# Patient Record
Sex: Male | Born: 1985 | Race: White | Hispanic: No | Smoking: Former smoker
Health system: Southern US, Community
[De-identification: ages and names within clinical notes are randomized; demographics above are authoritative.]

## PROBLEM LIST (undated history)

## (undated) DIAGNOSIS — E669 Obesity, unspecified: Secondary | ICD-10-CM

## (undated) DIAGNOSIS — Z720 Tobacco use: Secondary | ICD-10-CM

## (undated) DIAGNOSIS — S61432A Puncture wound without foreign body of left hand, initial encounter: Secondary | ICD-10-CM

## (undated) DIAGNOSIS — Z8709 Personal history of other diseases of the respiratory system: Secondary | ICD-10-CM

## (undated) DIAGNOSIS — W3400XA Accidental discharge from unspecified firearms or gun, initial encounter: Secondary | ICD-10-CM

## (undated) HISTORY — DX: Obesity, unspecified: E66.9

## (undated) HISTORY — DX: Tobacco use: Z72.0

## (undated) HISTORY — PX: HARVEST BONE GRAFT: SHX377

---

## 2010-02-27 ENCOUNTER — Emergency Department (HOSPITAL_COMMUNITY)
Admission: EM | Admit: 2010-02-27 | Discharge: 2010-02-28 | Payer: Self-pay | Source: Home / Self Care | Admitting: Emergency Medicine

## 2012-04-26 DIAGNOSIS — S61432A Puncture wound without foreign body of left hand, initial encounter: Secondary | ICD-10-CM

## 2012-04-26 HISTORY — PX: HAND SURGERY: SHX662

## 2012-04-26 HISTORY — DX: Puncture wound without foreign body of left hand, initial encounter: S61.432A

## 2012-05-30 ENCOUNTER — Emergency Department (HOSPITAL_COMMUNITY)
Admission: EM | Admit: 2012-05-30 | Discharge: 2012-05-30 | Disposition: A | Discharge status: Home / Self Care | Payer: 59 | Attending: Emergency Medicine | Admitting: Emergency Medicine

## 2012-05-30 ENCOUNTER — Encounter (HOSPITAL_COMMUNITY): Payer: Self-pay | Admitting: Emergency Medicine

## 2012-05-30 ENCOUNTER — Emergency Department (HOSPITAL_COMMUNITY): Payer: 59

## 2012-05-30 DIAGNOSIS — J45909 Unspecified asthma, uncomplicated: Secondary | ICD-10-CM | POA: Insufficient documentation

## 2012-05-30 DIAGNOSIS — G8918 Other acute postprocedural pain: Secondary | ICD-10-CM | POA: Insufficient documentation

## 2012-05-30 DIAGNOSIS — M25549 Pain in joints of unspecified hand: Secondary | ICD-10-CM | POA: Insufficient documentation

## 2012-05-30 DIAGNOSIS — F17200 Nicotine dependence, unspecified, uncomplicated: Secondary | ICD-10-CM | POA: Insufficient documentation

## 2012-05-30 MED ORDER — OXYCODONE-ACETAMINOPHEN 5-325 MG PO TABS: 2.0000 | ORAL_TABLET | ORAL | Status: DC | PRN
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
## 2012-05-30 NOTE — ED Notes (Addendum)
Ortho tech notified for left wrist splint

## 2012-05-30 NOTE — Progress Notes (Signed)
Orthopedic Tech Progress Note Patient Details:  Edwin Williams [birth year] [id number]  Ortho Devices Type of Ortho Device: Arm sling;Volar splint Ortho Device/Splint Location: left hand/arm Ortho Device/Splint Interventions: Application   Nikki Dom 05/30/2012, 9:29 PM
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
## 2012-06-02 ENCOUNTER — Encounter (HOSPITAL_BASED_OUTPATIENT_CLINIC_OR_DEPARTMENT_OTHER): Payer: Self-pay | Admitting: *Deleted
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
## 2012-06-03 ENCOUNTER — Encounter (HOSPITAL_BASED_OUTPATIENT_CLINIC_OR_DEPARTMENT_OTHER)
Admission: RE | Disposition: A | Discharge status: Home / Self Care | Payer: Self-pay | Source: Ambulatory Visit | Attending: Orthopedic Surgery

## 2012-06-03 ENCOUNTER — Encounter (HOSPITAL_BASED_OUTPATIENT_CLINIC_OR_DEPARTMENT_OTHER): Payer: Self-pay | Admitting: *Deleted

## 2012-06-03 ENCOUNTER — Ambulatory Visit (HOSPITAL_BASED_OUTPATIENT_CLINIC_OR_DEPARTMENT_OTHER)
Admission: RE | Admit: 2012-06-03 | Discharge: 2012-06-03 | Disposition: A | Discharge status: Home / Self Care | Payer: 59 | Source: Ambulatory Visit | Attending: Orthopedic Surgery | Admitting: Orthopedic Surgery

## 2012-06-03 ENCOUNTER — Ambulatory Visit (HOSPITAL_BASED_OUTPATIENT_CLINIC_OR_DEPARTMENT_OTHER): Payer: 59 | Admitting: *Deleted

## 2012-06-03 DIAGNOSIS — W3400XA Accidental discharge from unspecified firearms or gun, initial encounter: Secondary | ICD-10-CM | POA: Insufficient documentation

## 2012-06-03 DIAGNOSIS — S61432S Puncture wound without foreign body of left hand, sequela: Secondary | ICD-10-CM

## 2012-06-03 DIAGNOSIS — S62309B Unspecified fracture of unspecified metacarpal bone, initial encounter for open fracture: Secondary | ICD-10-CM | POA: Insufficient documentation

## 2012-06-03 DIAGNOSIS — S66992S Other injury of unspecified muscle, fascia and tendon at wrist and hand level, left hand, sequela: Secondary | ICD-10-CM

## 2012-06-03 HISTORY — PX: OPEN REDUCTION INTERNAL FIXATION (ORIF) METACARPAL: SHX6234

## 2012-06-03 HISTORY — DX: Personal history of other diseases of the respiratory system: Z87.09

## 2012-06-03 HISTORY — PX: HARDWARE REMOVAL: SHX979

## 2012-06-03 HISTORY — DX: Accidental discharge from unspecified firearms or gun, initial encounter: W34.00XA

## 2012-06-03 HISTORY — DX: Puncture wound without foreign body of left hand, initial encounter: S61.432A

## 2012-06-03 LAB — POCT HEMOGLOBIN-HEMACUE: Hemoglobin: 17 g/dL (ref 13.0–17.0)

## 2012-06-03 SURGERY — REMOVAL, HARDWARE
Anesthesia: General | Site: Hand | Laterality: Left | Wound class: Contaminated

## 2012-06-03 MED ORDER — FENTANYL CITRATE 0.05 MG/ML IJ SOLN
50.0000 ug | INTRAMUSCULAR | Status: DC | PRN
  Administered 2012-06-03: 100 ug via INTRAVENOUS

## 2012-06-03 MED ORDER — OXYCODONE-ACETAMINOPHEN 5-325 MG PO TABS: 1.0000 | ORAL_TABLET | ORAL | Status: DC | PRN

## 2012-06-03 MED ORDER — HYDROMORPHONE HCL PF 1 MG/ML IJ SOLN
0.2500 mg | INTRAMUSCULAR | Status: DC | PRN
  Administered 2012-06-03 (×2): 0.5 mg via INTRAVENOUS

## 2012-06-03 MED ORDER — CEFAZOLIN SODIUM-DEXTROSE 2-3 GM-% IV SOLR
INTRAVENOUS | Status: DC | PRN
  Administered 2012-06-03: 2 g via INTRAVENOUS

## 2012-06-03 MED ORDER — BUPIVACAINE-EPINEPHRINE PF 0.5-1:200000 % IJ SOLN
INTRAMUSCULAR | Status: DC | PRN
  Administered 2012-06-03: 30 mL

## 2012-06-03 MED ORDER — OXYCODONE HCL 5 MG PO TABS
5.0000 mg | ORAL_TABLET | Freq: Once | ORAL | Status: AC | PRN
  Administered 2012-06-03: 5 mg via ORAL

## 2012-06-03 MED ORDER — MIDAZOLAM HCL 2 MG/2ML IJ SOLN
1.0000 mg | INTRAMUSCULAR | Status: DC | PRN
  Administered 2012-06-03: 2 mg via INTRAVENOUS

## 2012-06-03 MED ORDER — PROMETHAZINE HCL 25 MG/ML IJ SOLN
6.2500 mg | Freq: Once | INTRAMUSCULAR | Status: AC
  Administered 2012-06-03: 6.25 mg via INTRAVENOUS

## 2012-06-03 MED ORDER — LIDOCAINE HCL (CARDIAC) 20 MG/ML IV SOLN
INTRAVENOUS | Status: DC | PRN
  Administered 2012-06-03: 80 mg via INTRAVENOUS

## 2012-06-03 MED ORDER — OXYCODONE HCL 5 MG/5ML PO SOLN: 5.0000 mg | Freq: Once | ORAL | Status: AC | PRN

## 2012-06-03 MED ORDER — ONDANSETRON HCL 4 MG/2ML IJ SOLN
INTRAMUSCULAR | Status: DC | PRN
  Administered 2012-06-03: 4 mg via INTRAVENOUS

## 2012-06-03 MED ORDER — LACTATED RINGERS IV SOLN
INTRAVENOUS | Status: DC
  Administered 2012-06-03 (×2): via INTRAVENOUS

## 2012-06-03 MED ORDER — ONDANSETRON HCL 4 MG/2ML IJ SOLN
4.0000 mg | Freq: Once | INTRAMUSCULAR | Status: AC | PRN
  Administered 2012-06-03: 4 mg via INTRAVENOUS

## 2012-06-03 MED ORDER — BUPIVACAINE-EPINEPHRINE 0.5% -1:200000 IJ SOLN
INTRAMUSCULAR | Status: DC | PRN
  Administered 2012-06-03: 20 mL

## 2012-06-03 MED ORDER — PROPOFOL 10 MG/ML IV BOLUS
INTRAVENOUS | Status: DC | PRN
  Administered 2012-06-03: 200 mg via INTRAVENOUS

## 2012-06-03 MED ORDER — MEPERIDINE HCL 25 MG/ML IJ SOLN: 6.2500 mg | INTRAMUSCULAR | Status: DC | PRN

## 2012-06-03 MED ORDER — MIDAZOLAM HCL 2 MG/ML PO SYRP: 12.0000 mg | ORAL_SOLUTION | Freq: Once | ORAL | Status: DC | PRN

## 2012-06-03 MED ORDER — DEXAMETHASONE SODIUM PHOSPHATE 4 MG/ML IJ SOLN
INTRAMUSCULAR | Status: DC | PRN
  Administered 2012-06-03: 10 mg via INTRAVENOUS

## 2012-06-03 SURGICAL SUPPLY — 88 items
APL SKNCLS STERI-STRIP NONHPOA (GAUZE/BANDAGES/DRESSINGS) ×1
BANDAGE ELASTIC 3 VELCRO ST LF (GAUZE/BANDAGES/DRESSINGS) ×2 IMPLANT
BANDAGE ELASTIC 4 VELCRO ST LF (GAUZE/BANDAGES/DRESSINGS) ×2 IMPLANT
BANDAGE GAUZE ELAST BULKY 4 IN (GAUZE/BANDAGES/DRESSINGS) ×2 IMPLANT
BENZOIN TINCTURE PRP APPL 2/3 (GAUZE/BANDAGES/DRESSINGS) ×2 IMPLANT
BIT DRILL 2 FAST STEP (BIT) ×2 IMPLANT
BLADE MINI RND TIP GREEN BEAV (BLADE) IMPLANT
BLADE SURG 15 STRL LF DISP TIS (BLADE) ×1 IMPLANT
BLADE SURG 15 STRL SS (BLADE) ×2
BNDG CMPR 9X4 STRL LF SNTH (GAUZE/BANDAGES/DRESSINGS)
BNDG CMPR MD 5X2 ELC HKLP STRL (GAUZE/BANDAGES/DRESSINGS)
BNDG ELASTIC 2 VLCR STRL LF (GAUZE/BANDAGES/DRESSINGS) IMPLANT
BNDG ESMARK 4X9 LF (GAUZE/BANDAGES/DRESSINGS) IMPLANT
CANISTER SUCTION 1200CC (MISCELLANEOUS) IMPLANT
CLOTH BEACON ORANGE TIMEOUT ST (SAFETY) ×2 IMPLANT
CORDS BIPOLAR (ELECTRODE) ×2 IMPLANT
COVER TABLE BACK 60X90 (DRAPES) ×2 IMPLANT
CUFF TOURNIQUET SINGLE 18IN (TOURNIQUET CUFF) IMPLANT
CUFF TOURNIQUET SINGLE 24IN (TOURNIQUET CUFF) ×2 IMPLANT
DECANTER SPIKE VIAL GLASS SM (MISCELLANEOUS) IMPLANT
DRAPE EXTREMITY T 121X128X90 (DRAPE) ×2 IMPLANT
DRAPE INCISE IOBAN 66X45 STRL (DRAPES) ×2 IMPLANT
DRAPE OEC MINIVIEW 54X84 (DRAPES) ×2 IMPLANT
DRAPE PED LAPAROTOMY (DRAPES) ×2 IMPLANT
DRAPE SURG 17X23 STRL (DRAPES) ×10 IMPLANT
DURAPREP 26ML APPLICATOR (WOUND CARE) ×2 IMPLANT
ELECT REM PT RETURN 9FT ADLT (ELECTROSURGICAL) ×2
ELECTRODE REM PT RTRN 9FT ADLT (ELECTROSURGICAL) ×1 IMPLANT
GAUZE SPONGE 4X4 16PLY XRAY LF (GAUZE/BANDAGES/DRESSINGS) IMPLANT
GAUZE XEROFORM 1X8 LF (GAUZE/BANDAGES/DRESSINGS) ×2 IMPLANT
GLOVE BIO SURGEON STRL SZ8 (GLOVE) ×2 IMPLANT
GLOVE BIOGEL PI IND STRL 6.5 (GLOVE) ×1 IMPLANT
GLOVE BIOGEL PI IND STRL 8.5 (GLOVE) ×1 IMPLANT
GLOVE BIOGEL PI INDICATOR 6.5 (GLOVE) ×1
GLOVE BIOGEL PI INDICATOR 8.5 (GLOVE) ×1
GLOVE ECLIPSE 6.5 STRL STRAW (GLOVE) ×2 IMPLANT
GLOVE SURG ORTHO 8.0 STRL STRW (GLOVE) ×2 IMPLANT
GOWN BRE IMP PREV XXLGXLNG (GOWN DISPOSABLE) ×2 IMPLANT
GOWN PREVENTION PLUS XLARGE (GOWN DISPOSABLE) ×2 IMPLANT
GOWN PREVENTION PLUS XXLARGE (GOWN DISPOSABLE) ×4 IMPLANT
NEEDLE HYPO 25X1 1.5 SAFETY (NEEDLE) ×2 IMPLANT
NS IRRIG 1000ML POUR BTL (IV SOLUTION) IMPLANT
PACK BASIN DAY SURGERY FS (CUSTOM PROCEDURE TRAY) ×2 IMPLANT
PAD CAST 3X4 CTTN HI CHSV (CAST SUPPLIES) ×1 IMPLANT
PAD CAST 4YDX4 CTTN HI CHSV (CAST SUPPLIES) ×1 IMPLANT
PADDING CAST ABS 4INX4YD NS (CAST SUPPLIES) ×1
PADDING CAST ABS COTTON 4X4 ST (CAST SUPPLIES) ×1 IMPLANT
PADDING CAST COTTON 3X4 STRL (CAST SUPPLIES) ×2
PADDING CAST COTTON 4X4 STRL (CAST SUPPLIES) ×2
PADDING UNDERCAST 2  STERILE (CAST SUPPLIES) ×2 IMPLANT
PENCIL BUTTON HOLSTER BLD 10FT (ELECTRODE) ×2 IMPLANT
PLATE LOCK 2.5MM Y-SHAPE (Plate) ×2 IMPLANT
SCREW PEG 10MM (Screw) ×2 IMPLANT
SCREW PEG 15MM (Screw) ×6 IMPLANT
SCREW PEG 2.5X12 NONLOCK (Screw) ×2 IMPLANT
SCREW PEG 2.5X16 NONLOCK (Screw) ×4 IMPLANT
SCREW PEG LOCK 2.5X10 (Peg) ×2 IMPLANT
SCREW PEG LOCK 2.5X14 (Peg) ×2 IMPLANT
SHEET MEDIUM DRAPE 40X70 STRL (DRAPES) ×2 IMPLANT
SPLINT PLASTER CAST XFAST 4X15 (CAST SUPPLIES) IMPLANT
SPLINT PLASTER XTRA FAST SET 4 (CAST SUPPLIES)
SPONGE GAUZE 4X4 12PLY (GAUZE/BANDAGES/DRESSINGS) ×4 IMPLANT
SPONGE LAP 4X18 X RAY DECT (DISPOSABLE) ×4 IMPLANT
STOCKINETTE 4X48 STRL (DRAPES) ×2 IMPLANT
STRIP CLOSURE SKIN 1/2X4 (GAUZE/BANDAGES/DRESSINGS) ×2 IMPLANT
SUCTION FRAZIER TIP 10 FR DISP (SUCTIONS) IMPLANT
SUT ETHILON 3 0 PS 1 (SUTURE) IMPLANT
SUT ETHILON 4 0 PS 2 18 (SUTURE) IMPLANT
SUT ETHILON 5 0 PS 2 18 (SUTURE) IMPLANT
SUT MERSILENE 4 0 P 3 (SUTURE) IMPLANT
SUT MON AB 4-0 PC3 18 (SUTURE) IMPLANT
SUT PROLENE 3 0 PS 2 (SUTURE) IMPLANT
SUT VIC AB 0 CT3 27 (SUTURE) IMPLANT
SUT VIC AB 0 SH 27 (SUTURE) IMPLANT
SUT VIC AB 2-0 SH 27 (SUTURE) ×2
SUT VIC AB 2-0 SH 27XBRD (SUTURE) ×1 IMPLANT
SUT VIC AB 3-0 PS1 18 (SUTURE)
SUT VIC AB 3-0 PS1 18XBRD (SUTURE) IMPLANT
SUT VIC AB 4-0 P-3 18XBRD (SUTURE) IMPLANT
SUT VIC AB 4-0 P3 18 (SUTURE)
SUT VICRYL 4-0 PS2 18IN ABS (SUTURE) IMPLANT
SUT VICRYL RAPIDE 4/0 PS 2 (SUTURE) ×2 IMPLANT
SYR BULB 3OZ (MISCELLANEOUS) ×2 IMPLANT
SYRINGE 10CC LL (SYRINGE) ×2 IMPLANT
TOWEL OR 17X24 6PK STRL BLUE (TOWEL DISPOSABLE) ×2 IMPLANT
TUBE CONNECTING 20X1/4 (TUBING) IMPLANT
UNDERPAD 30X30 INCONTINENT (UNDERPADS AND DIAPERS) ×2 IMPLANT
WASHER 2.5 THREADED (Orthopedic Implant) ×2 IMPLANT

## 2012-06-03 NOTE — Op Note (Signed)
See note [phone]

## 2012-06-03 NOTE — Transfer of Care (Signed)
Immediate Anesthesia Transfer of Care Note  Patient: Edwin Williams  Procedure(s) Performed: Procedure(s): Removal of External Fixator Left Fifth Metacarpal (Left) Open reduction internal fixation left fifth metacarpal with illiac crest graft (Left)  Patient Location: PACU  Anesthesia Type:GA combined with regional for post-op pain  Level of Consciousness: sedated  Airway & Oxygen Therapy: Patient Spontanous Breathing and Patient connected to face mask oxygen  Post-op Assessment: Report given to PACU RN and Post -op Vital signs reviewed and stable  Post vital signs: Reviewed and stable  Complications: No apparent anesthesia complications
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
## 2012-06-03 NOTE — Anesthesia Preprocedure Evaluation (Signed)

## 2012-06-03 NOTE — Progress Notes (Signed)
Assisted Dr. Ossey with left, ultrasound guided, supraclavicular block. Side rails up, monitors on throughout procedure. See vital signs in flow sheet. Tolerated Procedure well. 

## 2012-06-03 NOTE — Anesthesia Postprocedure Evaluation (Signed)
Anesthesia Post Note  Patient: Edwin Williams  Procedure(s) Performed: Procedure(s) (LRB): Removal of External Fixator Left Fifth Metacarpal (Left) Open reduction internal fixation left fifth metacarpal with illiac crest graft (Left)  Anesthesia type: general  Patient location: PACU  Post pain: Pain level controlled  Post assessment: Patient's Cardiovascular Status Stable  Last Vitals:  Filed Vitals:   06/03/12 1645  BP: 100/68  Pulse: 82  Temp:   Resp: 15    Post vital signs: Reviewed and stable  Level of consciousness: sedated  Complications: No apparent anesthesia complications
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
## 2012-06-04 ENCOUNTER — Encounter (HOSPITAL_BASED_OUTPATIENT_CLINIC_OR_DEPARTMENT_OTHER): Payer: Self-pay | Admitting: Orthopedic Surgery
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
## 2012-07-11 ENCOUNTER — Emergency Department (HOSPITAL_COMMUNITY): Payer: 59

## 2012-07-11 ENCOUNTER — Emergency Department (HOSPITAL_COMMUNITY)
Admission: EM | Admit: 2012-07-11 | Discharge: 2012-07-11 | Disposition: A | Discharge status: Home / Self Care | Payer: 59 | Attending: Emergency Medicine | Admitting: Emergency Medicine

## 2012-07-11 DIAGNOSIS — F17200 Nicotine dependence, unspecified, uncomplicated: Secondary | ICD-10-CM | POA: Insufficient documentation

## 2012-07-11 DIAGNOSIS — Z880 Allergy status to penicillin: Secondary | ICD-10-CM | POA: Insufficient documentation

## 2012-07-11 DIAGNOSIS — Z87828 Personal history of other (healed) physical injury and trauma: Secondary | ICD-10-CM | POA: Insufficient documentation

## 2012-07-11 DIAGNOSIS — Y939 Activity, unspecified: Secondary | ICD-10-CM | POA: Insufficient documentation

## 2012-07-11 DIAGNOSIS — S6990XA Unspecified injury of unspecified wrist, hand and finger(s), initial encounter: Secondary | ICD-10-CM | POA: Insufficient documentation

## 2012-07-11 DIAGNOSIS — IMO0002 Reserved for concepts with insufficient information to code with codable children: Secondary | ICD-10-CM | POA: Insufficient documentation

## 2012-07-11 DIAGNOSIS — Y929 Unspecified place or not applicable: Secondary | ICD-10-CM | POA: Insufficient documentation

## 2012-07-11 DIAGNOSIS — J45909 Unspecified asthma, uncomplicated: Secondary | ICD-10-CM | POA: Insufficient documentation

## 2012-07-11 DIAGNOSIS — S6992XA Unspecified injury of left wrist, hand and finger(s), initial encounter: Secondary | ICD-10-CM

## 2012-07-11 MED ORDER — OXYCODONE-ACETAMINOPHEN 5-325 MG PO TABS
2.0000 | ORAL_TABLET | Freq: Once | ORAL | Status: AC
  Administered 2012-07-11: 2 via ORAL
  Filled 2012-07-11: qty 2

## 2012-07-11 MED ORDER — OXYCODONE-ACETAMINOPHEN 5-325 MG PO TABS: 1.0000 | ORAL_TABLET | ORAL | Status: DC | PRN

## 2012-07-11 NOTE — ED Notes (Signed)
Patient transported to X-ray 
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
## 2012-07-11 NOTE — ED Notes (Signed)
Pt can wiggle digits, sensation present, skin warm, dry and pink
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
## 2012-07-12 NOTE — ED Provider Notes (Signed)
  Medical screening examination/treatment/procedure(s) were performed by non-physician practitioner and as supervising physician I was immediately available for consultation/collaboration.    Dodger Sinning D Francy Mcilvaine, MD 07/12/12 2117 

## 2016-02-18 ENCOUNTER — Encounter (HOSPITAL_COMMUNITY): Payer: Self-pay | Admitting: Emergency Medicine

## 2016-02-18 ENCOUNTER — Emergency Department (HOSPITAL_COMMUNITY)
Admission: EM | Admit: 2016-02-18 | Discharge: 2016-02-18 | Disposition: A | Discharge status: Home / Self Care | Payer: 59 | Attending: Emergency Medicine | Admitting: Emergency Medicine

## 2016-02-18 ENCOUNTER — Emergency Department (HOSPITAL_COMMUNITY): Payer: 59

## 2016-02-18 DIAGNOSIS — Y929 Unspecified place or not applicable: Secondary | ICD-10-CM | POA: Insufficient documentation

## 2016-02-18 DIAGNOSIS — S62399A Other fracture of unspecified metacarpal bone, initial encounter for closed fracture: Secondary | ICD-10-CM

## 2016-02-18 DIAGNOSIS — W208XXA Other cause of strike by thrown, projected or falling object, initial encounter: Secondary | ICD-10-CM | POA: Insufficient documentation

## 2016-02-18 DIAGNOSIS — J45909 Unspecified asthma, uncomplicated: Secondary | ICD-10-CM | POA: Insufficient documentation

## 2016-02-18 DIAGNOSIS — Y999 Unspecified external cause status: Secondary | ICD-10-CM | POA: Insufficient documentation

## 2016-02-18 DIAGNOSIS — S63266A Dislocation of metacarpophalangeal joint of right little finger, initial encounter: Secondary | ICD-10-CM

## 2016-02-18 DIAGNOSIS — S62306A Unspecified fracture of fifth metacarpal bone, right hand, initial encounter for closed fracture: Secondary | ICD-10-CM | POA: Insufficient documentation

## 2016-02-18 DIAGNOSIS — Y939 Activity, unspecified: Secondary | ICD-10-CM | POA: Insufficient documentation

## 2016-02-18 DIAGNOSIS — F17210 Nicotine dependence, cigarettes, uncomplicated: Secondary | ICD-10-CM | POA: Insufficient documentation

## 2016-02-18 DIAGNOSIS — S63256A Unspecified dislocation of right little finger, initial encounter: Secondary | ICD-10-CM | POA: Insufficient documentation

## 2016-02-18 MED ORDER — OXYCODONE-ACETAMINOPHEN 5-325 MG PO TABS
1.0000 | ORAL_TABLET | ORAL | Status: DC | PRN
Start: 2016-02-18 — End: 2016-02-18
  Administered 2016-02-18: 1 via ORAL

## 2016-02-18 MED ORDER — LIDOCAINE HCL 2 % IJ SOLN
20.0000 mL | Freq: Once | INTRAMUSCULAR | Status: AC
  Administered 2016-02-18: 400 mg via INTRADERMAL
  Filled 2016-02-18: qty 20

## 2016-02-18 MED ORDER — HYDROMORPHONE HCL 2 MG/ML IJ SOLN
1.0000 mg | Freq: Once | INTRAMUSCULAR | Status: AC
  Administered 2016-02-18: 1 mg via INTRAVENOUS
  Filled 2016-02-18: qty 1

## 2016-02-18 MED ORDER — OXYCODONE-ACETAMINOPHEN 5-325 MG PO TABS
ORAL_TABLET | ORAL | Status: AC
  Filled 2016-02-18: qty 1

## 2016-02-18 NOTE — ED Notes (Signed)
Dr. Roxanne MinsGrahmig at bedside.

## 2016-02-18 NOTE — ED Notes (Signed)
Dr. Roxanne MinsGrahmig coming down to set hand. Ortho tech has been called to meet him in D34. C arm, lidocaine and suture tray at bedside.
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
## 2016-02-18 NOTE — Discharge Instructions (Addendum)
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
## 2016-02-18 NOTE — ED Triage Notes (Addendum)
Pt reports trailer door fell onto right hand causing pain and injury. Pt presents with swelling and deformity to right hand. + Pulses noted.

## 2016-11-11 ENCOUNTER — Encounter: Payer: Self-pay | Admitting: Physician Assistant

## 2016-11-11 ENCOUNTER — Ambulatory Visit (INDEPENDENT_AMBULATORY_CARE_PROVIDER_SITE_OTHER): Payer: PRIVATE HEALTH INSURANCE | Admitting: Physician Assistant

## 2016-11-11 VITALS — BP 134/82 | HR 84 | Ht 70.0 in | Wt 240.0 lb

## 2016-11-11 DIAGNOSIS — Z7689 Persons encountering health services in other specified circumstances: Secondary | ICD-10-CM

## 2016-11-11 DIAGNOSIS — G8929 Other chronic pain: Secondary | ICD-10-CM | POA: Diagnosis not present

## 2016-11-11 DIAGNOSIS — R202 Paresthesia of skin: Secondary | ICD-10-CM | POA: Diagnosis not present

## 2016-11-11 DIAGNOSIS — Z3009 Encounter for other general counseling and advice on contraception: Secondary | ICD-10-CM

## 2016-11-11 DIAGNOSIS — M25511 Pain in right shoulder: Secondary | ICD-10-CM | POA: Diagnosis not present

## 2016-11-11 DIAGNOSIS — F17200 Nicotine dependence, unspecified, uncomplicated: Secondary | ICD-10-CM | POA: Insufficient documentation

## 2016-11-11 DIAGNOSIS — Z6834 Body mass index (BMI) 34.0-34.9, adult: Secondary | ICD-10-CM

## 2016-11-11 DIAGNOSIS — E6609 Other obesity due to excess calories: Secondary | ICD-10-CM | POA: Insufficient documentation

## 2016-11-11 MED ORDER — DICLOFENAC SODIUM 75 MG PO TBEC: 75.0000 mg | DELAYED_RELEASE_TABLET | Freq: Two times a day (BID) | ORAL | 0 refills | Status: DC

## 2016-11-11 NOTE — Patient Instructions (Addendum)
Diclofenac 1 tablet twice a day for pain/inflammation Night-time spliting Formal physical therapy Follow-up with Sports Medicine
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
## 2016-11-15 ENCOUNTER — Telehealth: Payer: Self-pay

## 2016-11-15 DIAGNOSIS — G8929 Other chronic pain: Secondary | ICD-10-CM

## 2016-11-15 DIAGNOSIS — M25511 Pain in right shoulder: Principal | ICD-10-CM

## 2016-11-15 NOTE — Telephone Encounter (Signed)
Pt reports that he Voltaren makes him sick every time he takes it and is requesting something different. Please advise.  -EH/RMA

## 2016-11-18 MED ORDER — NAPROXEN 500 MG PO TABS: 500.0000 mg | ORAL_TABLET | Freq: Two times a day (BID) | ORAL | 0 refills | Status: DC

## 2016-11-18 NOTE — Telephone Encounter (Signed)
Discontinue Voltaren Start naprosyn 1 tab twice a day Instruct to take with food

## 2016-11-26 ENCOUNTER — Institutional Professional Consult (permissible substitution): Payer: PRIVATE HEALTH INSURANCE | Admitting: Sports Medicine

## 2016-11-26 DIAGNOSIS — Z0189 Encounter for other specified special examinations: Secondary | ICD-10-CM

## 2017-10-19 ENCOUNTER — Other Ambulatory Visit: Payer: Self-pay

## 2017-10-19 ENCOUNTER — Emergency Department
Admission: EM | Admit: 2017-10-19 | Discharge: 2017-10-19 | Disposition: A | Discharge status: Home / Self Care | Payer: Self-pay | Source: Home / Self Care | Attending: Emergency Medicine | Admitting: Emergency Medicine

## 2017-10-19 ENCOUNTER — Emergency Department (INDEPENDENT_AMBULATORY_CARE_PROVIDER_SITE_OTHER): Payer: Self-pay

## 2017-10-19 ENCOUNTER — Encounter: Payer: Self-pay | Admitting: Emergency Medicine

## 2017-10-19 DIAGNOSIS — S6992XA Unspecified injury of left wrist, hand and finger(s), initial encounter: Secondary | ICD-10-CM

## 2017-10-19 DIAGNOSIS — X58XXXA Exposure to other specified factors, initial encounter: Secondary | ICD-10-CM

## 2017-10-19 DIAGNOSIS — S60212A Contusion of left wrist, initial encounter: Secondary | ICD-10-CM

## 2017-10-19 DIAGNOSIS — S60222A Contusion of left hand, initial encounter: Secondary | ICD-10-CM

## 2017-10-19 MED ORDER — NAPROXEN 500 MG PO TABS: 500.0000 mg | ORAL_TABLET | Freq: Two times a day (BID) | ORAL | 0 refills | Status: DC

## 2017-10-19 MED ORDER — HYDROCODONE-ACETAMINOPHEN 5-325 MG PO TABS: 2.0000 | ORAL_TABLET | Freq: Every day | ORAL | 0 refills | Status: DC

## 2017-10-19 NOTE — Discharge Instructions (Addendum)
X-rays left hand and left wrist show soft tissue swelling left hand, but no fracture or dislocation seen. Treatment is rest, ice, Ace, elevation of left hand and wrist for the next week, then gradual increase range of motion and activity.  No heavy gripping or lifting with left hand or wrist. Naprosyn for mild to moderate pain. Small prescription for Vicodin, may take up to 2 at bedtime if needed for severe pain. Follow-up with your orthopedist within 1 week for recheck
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
## 2018-06-05 ENCOUNTER — Ambulatory Visit (INDEPENDENT_AMBULATORY_CARE_PROVIDER_SITE_OTHER): Payer: Self-pay | Admitting: Physician Assistant

## 2018-06-05 ENCOUNTER — Encounter: Payer: Self-pay | Admitting: Physician Assistant

## 2018-06-05 VITALS — Wt 230.0 lb

## 2018-06-05 DIAGNOSIS — F909 Attention-deficit hyperactivity disorder, unspecified type: Secondary | ICD-10-CM

## 2018-06-05 DIAGNOSIS — R030 Elevated blood-pressure reading, without diagnosis of hypertension: Secondary | ICD-10-CM

## 2018-06-05 MED ORDER — AMPHETAMINE-DEXTROAMPHET ER 10 MG PO CP24: 10.0000 mg | ORAL_CAPSULE | Freq: Every day | ORAL | 0 refills | Status: DC
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
## 2018-07-02 ENCOUNTER — Ambulatory Visit: Payer: Self-pay | Admitting: Physician Assistant

## 2018-07-03 ENCOUNTER — Ambulatory Visit (INDEPENDENT_AMBULATORY_CARE_PROVIDER_SITE_OTHER): Payer: PRIVATE HEALTH INSURANCE | Admitting: Physician Assistant

## 2018-07-03 ENCOUNTER — Encounter: Payer: Self-pay | Admitting: Physician Assistant

## 2018-07-03 VITALS — BP 130/76 | HR 90 | Temp 99.2°F | Resp 14 | Wt 235.0 lb

## 2018-07-03 DIAGNOSIS — I10 Essential (primary) hypertension: Secondary | ICD-10-CM | POA: Diagnosis not present

## 2018-07-03 DIAGNOSIS — Z130 Encounter for screening for diseases of the blood and blood-forming organs and certain disorders involving the immune mechanism: Secondary | ICD-10-CM

## 2018-07-03 DIAGNOSIS — F17210 Nicotine dependence, cigarettes, uncomplicated: Secondary | ICD-10-CM | POA: Diagnosis not present

## 2018-07-03 DIAGNOSIS — F909 Attention-deficit hyperactivity disorder, unspecified type: Secondary | ICD-10-CM | POA: Diagnosis not present

## 2018-07-03 DIAGNOSIS — R509 Fever, unspecified: Secondary | ICD-10-CM | POA: Diagnosis not present

## 2018-07-03 DIAGNOSIS — Z1329 Encounter for screening for other suspected endocrine disorder: Secondary | ICD-10-CM

## 2018-07-03 DIAGNOSIS — Z1389 Encounter for screening for other disorder: Secondary | ICD-10-CM

## 2018-07-03 MED ORDER — AMPHETAMINE-DEXTROAMPHET ER 10 MG PO CP24: 10.0000 mg | ORAL_CAPSULE | Freq: Every day | ORAL | 0 refills | Status: DC

## 2018-07-03 MED ORDER — AMPHETAMINE-DEXTROAMPHETAMINE 10 MG PO TABS: 10.0000 mg | ORAL_TABLET | Freq: Every day | ORAL | 0 refills | Status: DC

## 2018-07-03 MED ORDER — VARENICLINE TARTRATE 0.5 MG X 11 & 1 MG X 42 PO MISC: ORAL | 0 refills | Status: DC

## 2018-07-03 MED ORDER — AMLODIPINE BESYLATE 5 MG PO TABS: 5.0000 mg | ORAL_TABLET | Freq: Every day | ORAL | 5 refills | Status: DC

## 2018-07-03 NOTE — Patient Instructions (Addendum)
For your blood pressure: - Goal <130/80 (Ideally 120's/70's) - take your blood pressure medication in the morning (unless instructed differently) - monitor and log blood pressures at home - check around the same time each day in a relaxed setting - Limit salt to <2500 mg/day - Follow DASH (Dietary Approach to Stopping Hypertension) eating plan - Try to get at least 150 minutes of aerobic exercise per week - Aim to go on a brisk walk 30 minutes per day at least 5 days per week. If you're not active, gradually increase how long you walk by 5 minutes each week - limit alcohol: 2 standard drinks per day for men and 1 per day for women - avoid tobacco/nicotine products. Consider smoking cessation if you smoke - weight loss: 7% of current body weight can reduce your blood pressure by 5-10 points - follow-up at least every 6 months for your blood pressure. Follow-up sooner if your BP is not controlled   Coping with Quitting Smoking  Quitting smoking is a physical and mental challenge. You will face cravings, withdrawal symptoms, and temptation. Before quitting, work with your health care provider to make a plan that can help you cope. Preparation can help you quit and keep you from giving in. How can I cope with cravings? Cravings usually last for 5-10 minutes. If you get through it, the craving will pass. Consider taking the following actions to help you cope with cravings:  Keep your mouth busy: ? Chew sugar-free gum. ? Suck on hard candies or a straw. ? Brush your teeth.  Keep your hands and body busy: ? Immediately change to a different activity when you feel a craving. ? Squeeze or play with a ball. ? Do an activity or a hobby, like making bead jewelry, practicing needlepoint, or working with wood. ? Mix up your normal routine. ? Take a short exercise break. Go for a quick walk or run up and down stairs. ? Spend time in public places where smoking is not allowed.  Focus on doing  something kind or helpful for someone else.  Call a friend or family member to talk during a craving.  Join a support group.  Call a quit line, such as 1-800-QUIT-NOW.  Talk with your health care provider about medicines that might help you cope with cravings and make quitting easier for you. How can I deal with withdrawal symptoms? Your body may experience negative effects as it tries to get used to not having nicotine in the system. These effects are called withdrawal symptoms. They may include:  Feeling hungrier than normal.  Trouble concentrating.  Irritability.  Trouble sleeping.  Feeling depressed.  Restlessness and agitation.  Craving a cigarette. To manage withdrawal symptoms:  Avoid places, people, and activities that trigger your cravings.  Remember why you want to quit.  Get plenty of sleep.  Avoid coffee and other caffeinated drinks. These may worsen some of your symptoms. How can I handle social situations? Social situations can be difficult when you are quitting smoking, especially in the first few weeks. To manage this, you can:  Avoid parties, bars, and other social situations where people might be smoking.  Avoid alcohol.  Leave right away if you have the urge to smoke.  Explain to your family and friends that you are quitting smoking. Ask for understanding and support.  Plan activities with friends or family where smoking is not an option. What are some ways I can cope with stress? Wanting to smoke may  cause stress, and stress can make you want to smoke. Find ways to manage your stress. Relaxation techniques can help. For example:  Breathe slowly and deeply, in through your nose and out through your mouth.  Listen to soothing, relaxing music.  Talk with a family member or friend about your stress.  Light a candle.  Soak in a bath or take a shower.  Think about a peaceful place. What are some ways I can prevent weight gain? Be aware that  many people gain weight after they quit smoking. However, not everyone does. To keep from gaining weight, have a plan in place before you quit and stick to the plan after you quit. Your plan should include:  Having healthy snacks. When you have a craving, it may help to: ? Eat plain popcorn, crunchy carrots, celery, or other cut vegetables. ? Chew sugar-free gum.  Changing how you eat: ? Eat small portion sizes at meals. ? Eat 4-6 small meals throughout the day instead of 1-2 large meals a day. ? Be mindful when you eat. Do not watch television or do other things that might distract you as you eat.  Exercising regularly: ? Make time to exercise each day. If you do not have time for a long workout, do short bouts of exercise for 5-10 minutes several times a day. ? Do some form of strengthening exercise, like weight lifting, and some form of aerobic exercise, like running or swimming.  Drinking plenty of water or other low-calorie or no-calorie drinks. Drink 6-8 glasses of water daily, or as much as instructed by your health care provider. Summary  Quitting smoking is a physical and mental challenge. You will face cravings, withdrawal symptoms, and temptation to smoke again. Preparation can help you as you go through these challenges.  You can cope with cravings by keeping your mouth busy (such as by chewing gum), keeping your body and hands busy, and making calls to family, friends, or a helpline for people who want to quit smoking.  You can cope with withdrawal symptoms by avoiding places where people smoke, avoiding drinks with caffeine, and getting plenty of rest.  Ask your health care provider about the different ways to prevent weight gain, avoid stress, and handle social situations. This information is not intended to replace advice given to you by your health care provider. Make sure you discuss any questions you have with your health care provider. Document Released: 01/19/2016  Document Revised: 01/19/2016 Document Reviewed: 01/19/2016 Elsevier Interactive Patient Education  2019 [employer]
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
## 2018-07-04 LAB — CBC
HCT: 48.4 % (ref 38.5–50.0)
Hemoglobin: 17 g/dL (ref 13.2–17.1)
MCH: 31 pg (ref 27.0–33.0)
MCHC: 35.1 g/dL (ref 32.0–36.0)
MCV: 88.3 fL (ref 80.0–100.0)
MPV: 9.2 fL (ref 7.5–12.5)
Platelets: 373 10*3/uL (ref 140–400)
RBC: 5.48 10*6/uL (ref 4.20–5.80)
RDW: 12.7 % (ref 11.0–15.0)
WBC: 10.6 10*3/uL (ref 3.8–10.8)

## 2018-07-04 LAB — URINALYSIS, ROUTINE W REFLEX MICROSCOPIC
Bilirubin Urine: NEGATIVE
Glucose, UA: NEGATIVE
Hgb urine dipstick: NEGATIVE
Leukocytes,Ua: NEGATIVE
Nitrite: NEGATIVE
Protein, ur: NEGATIVE
Specific Gravity, Urine: 1.025 (ref 1.001–1.03)
pH: 5.5 (ref 5.0–8.0)

## 2018-07-04 LAB — COMPLETE METABOLIC PANEL WITH GFR
AG Ratio: 2.5 (calc) (ref 1.0–2.5)
ALT: 20 U/L (ref 9–46)
AST: 15 U/L (ref 10–40)
Albumin: 4.8 g/dL (ref 3.6–5.1)
Alkaline phosphatase (APISO): 64 U/L (ref 36–130)
BUN: 12 mg/dL (ref 7–25)
CO2: 27 mmol/L (ref 20–32)
Calcium: 9.9 mg/dL (ref 8.6–10.3)
Chloride: 106 mmol/L (ref 98–110)
Creat: 0.91 mg/dL (ref 0.60–1.35)
GFR, Est African American: 130 mL/min/{1.73_m2} (ref >=60)
GFR, Est Non African American: 112 mL/min/{1.73_m2} (ref >=60)
Globulin: 1.9 g/dL (calc) (ref 1.9–3.7)
Glucose, Bld: 93 mg/dL (ref 65–99)
Potassium: 4.1 mmol/L (ref 3.5–5.3)
Sodium: 141 mmol/L (ref 135–146)
Total Bilirubin: 0.5 mg/dL (ref 0.2–1.2)
Total Protein: 6.7 g/dL (ref 6.1–8.1)

## 2018-07-04 LAB — TSH+FREE T4: TSH W/REFLEX TO FT4: 2 mIU/L (ref 0.40–4.50)

## 2018-07-06 ENCOUNTER — Encounter: Payer: Self-pay | Admitting: Physician Assistant

## 2018-07-06 DIAGNOSIS — R824 Acetonuria: Secondary | ICD-10-CM | POA: Insufficient documentation

## 2018-08-04 ENCOUNTER — Ambulatory Visit (INDEPENDENT_AMBULATORY_CARE_PROVIDER_SITE_OTHER): Payer: PRIVATE HEALTH INSURANCE | Admitting: Physician Assistant

## 2018-08-04 ENCOUNTER — Encounter: Payer: Self-pay | Admitting: Physician Assistant

## 2018-08-04 DIAGNOSIS — F909 Attention-deficit hyperactivity disorder, unspecified type: Secondary | ICD-10-CM | POA: Diagnosis not present

## 2018-08-04 DIAGNOSIS — I10 Essential (primary) hypertension: Secondary | ICD-10-CM | POA: Diagnosis not present

## 2018-08-04 MED ORDER — AMPHETAMINE-DEXTROAMPHETAMINE 10 MG PO TABS: 10.0000 mg | ORAL_TABLET | Freq: Every day | ORAL | 0 refills | Status: DC

## 2018-08-04 MED ORDER — AMPHETAMINE-DEXTROAMPHET ER 10 MG PO CP24: 10.0000 mg | ORAL_CAPSULE | Freq: Every day | ORAL | 0 refills | Status: DC

## 2018-08-04 MED ORDER — AMLODIPINE BESYLATE 5 MG PO TABS: 5.0000 mg | ORAL_TABLET | Freq: Every day | ORAL | 0 refills | Status: DC
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
## 2018-08-19 ENCOUNTER — Encounter: Payer: Self-pay | Admitting: Physician Assistant

## 2018-08-26 ENCOUNTER — Encounter: Payer: Self-pay | Admitting: Physician Assistant

## 2018-09-04 ENCOUNTER — Other Ambulatory Visit: Payer: Self-pay | Admitting: Physician Assistant

## 2018-09-04 ENCOUNTER — Encounter: Payer: Self-pay | Admitting: Physician Assistant

## 2018-09-04 DIAGNOSIS — F909 Attention-deficit hyperactivity disorder, unspecified type: Secondary | ICD-10-CM

## 2018-09-07 ENCOUNTER — Encounter: Payer: Self-pay | Admitting: Physician Assistant

## 2018-09-07 ENCOUNTER — Ambulatory Visit (INDEPENDENT_AMBULATORY_CARE_PROVIDER_SITE_OTHER): Payer: PRIVATE HEALTH INSURANCE | Admitting: Physician Assistant

## 2018-09-07 ENCOUNTER — Other Ambulatory Visit: Payer: Self-pay

## 2018-09-07 VITALS — BP 122/73 | HR 97 | Temp 98.2°F | Wt 237.0 lb

## 2018-09-07 DIAGNOSIS — L03119 Cellulitis of unspecified part of limb: Secondary | ICD-10-CM | POA: Diagnosis not present

## 2018-09-07 DIAGNOSIS — F909 Attention-deficit hyperactivity disorder, unspecified type: Secondary | ICD-10-CM

## 2018-09-07 DIAGNOSIS — L02419 Cutaneous abscess of limb, unspecified: Secondary | ICD-10-CM

## 2018-09-07 MED ORDER — DOXYCYCLINE HYCLATE 100 MG PO TABS: 100.0000 mg | ORAL_TABLET | Freq: Two times a day (BID) | ORAL | 0 refills | Status: AC

## 2018-09-07 MED ORDER — HYDROCODONE-ACETAMINOPHEN 5-325 MG PO TABS: 1.0000 | ORAL_TABLET | Freq: Four times a day (QID) | ORAL | 0 refills | Status: AC | PRN

## 2018-09-07 MED ORDER — AMPHETAMINE-DEXTROAMPHET ER 10 MG PO CP24: 10.0000 mg | ORAL_CAPSULE | Freq: Every day | ORAL | 0 refills | Status: DC

## 2018-09-07 MED ORDER — AMPHETAMINE-DEXTROAMPHETAMINE 10 MG PO TABS: 10.0000 mg | ORAL_TABLET | Freq: Every day | ORAL | 0 refills | Status: DC
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
## 2018-09-07 NOTE — Patient Instructions (Signed)
Skin Abscess  A skin abscess is an infected area on or under your skin that contains a collection of pus and other material. An abscess may also be called a furuncle, carbuncle, or boil. An abscess can occur in or on almost any part of your body. Some abscesses break open (rupture) on their own. Most continue to get worse unless they are treated. The infection can spread deeper into the body and eventually into your blood, which can make you feel ill. Treatment usually involves draining the abscess. What are the causes? An abscess occurs when germs, like bacteria, pass through your skin and cause an infection. This may be caused by:  A scrape or cut on your skin.  A puncture wound through your skin, including a needle injection or insect bite.  Blocked oil or sweat glands.  Blocked and infected hair follicles.  A cyst that forms beneath your skin (sebaceous cyst) and becomes infected. What increases the risk? This condition is more likely to develop in people who:  Have a weak body defense system (immune system).  Have diabetes.  Have dry and irritated skin.  Get frequent injections or use illegal IV drugs.  Have a foreign body in a wound, such as a splinter.  Have problems with their lymph system or veins. What are the signs or symptoms? Symptoms of this condition include:  A painful, firm bump under the skin.  A bump with pus at the top. This may break through the skin and drain. Other symptoms include:  Redness surrounding the abscess site.  Warmth.  Swelling of the lymph nodes (glands) near the abscess.  Tenderness.  A sore on the skin. How is this diagnosed? This condition may be diagnosed based on:  A physical exam.  Your medical history.  A sample of pus. This may be used to find out what is causing the infection.  Blood tests.  Imaging tests, such as an ultrasound, CT scan, or MRI. How is this treated? A small abscess that drains on its own may not  need treatment. Treatment for larger abscesses may include:  Moist heat or heat pack applied to the area several times a day.  A procedure to drain the abscess (incision and drainage).  Antibiotic medicines. For a severe abscess, you may first get antibiotics through an IV and then change to antibiotics by mouth. Follow these instructions at home: Medicines   Take over-the-counter and prescription medicines only as told by your health care provider.  If you were prescribed an antibiotic medicine, take it as told by your health care provider. Do not stop taking the antibiotic even if you start to feel better. Abscess care   If you have an abscess that has not drained, apply heat to the affected area. Use the heat source that your health care provider recommends, such as a moist heat pack or a heating pad. ? Place a towel between your skin and the heat source. ? Leave the heat on for 20-30 minutes. ? Remove the heat if your skin turns bright red. This is especially important if you are unable to feel pain, heat, or cold. You may have a greater risk of getting burned.  Follow instructions from your health care provider about how to take care of your abscess. Make sure you: ? Cover the abscess with a bandage (dressing). ? Change your dressing or gauze as told by your health care provider. ? Wash your hands with soap and water before you change the   dressing or gauze. If soap and water are not available, use hand sanitizer.  Check your abscess every day for signs of a worsening infection. Check for: ? More redness, swelling, or pain. ? More fluid or blood. ? Warmth. ? More pus or a bad smell. General instructions  To avoid spreading the infection: ? Do not share personal care items, towels, or hot tubs with others. ? Avoid making skin contact with other people.  Keep all follow-up visits as told by your health care provider. This is important. Contact a health care provider if you  have:  More redness, swelling, or pain around your abscess.  More fluid or blood coming from your abscess.  Warm skin around your abscess.  More pus or a bad smell coming from your abscess.  A fever.  Muscle aches.  Chills or a general ill feeling. Get help right away if you:  Have severe pain.  See red streaks on your skin spreading away from the abscess. Summary  A skin abscess is an infected area on or under your skin that contains a collection of pus and other material.  A small abscess that drains on its own may not need treatment.  Treatment for larger abscesses may include having a procedure to drain the abscess and taking an antibiotic. This information is not intended to replace advice given to you by your health care provider. Make sure you discuss any questions you have with your health care provider. Document Released: 10/31/2004 Document Revised: 05/14/2018 Document Reviewed: 03/06/2017 Elsevier Patient Education  2020 [employer]  

## 2018-09-08 ENCOUNTER — Encounter: Payer: Self-pay | Admitting: Physician Assistant

## 2018-09-09 ENCOUNTER — Other Ambulatory Visit: Payer: Self-pay

## 2018-09-09 ENCOUNTER — Ambulatory Visit (INDEPENDENT_AMBULATORY_CARE_PROVIDER_SITE_OTHER): Payer: PRIVATE HEALTH INSURANCE | Admitting: Physician Assistant

## 2018-09-09 VITALS — BP 114/75 | HR 87 | Temp 98.0°F

## 2018-09-09 DIAGNOSIS — L03119 Cellulitis of unspecified part of limb: Secondary | ICD-10-CM

## 2018-09-09 DIAGNOSIS — L02419 Cutaneous abscess of limb, unspecified: Secondary | ICD-10-CM
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
## 2018-09-10 LAB — WOUND CULTURE
MICRO NUMBER:: 730483
SPECIMEN QUALITY:: ADEQUATE

## 2018-09-11 ENCOUNTER — Encounter: Payer: Self-pay | Admitting: Physician Assistant

## 2018-09-11 ENCOUNTER — Ambulatory Visit (INDEPENDENT_AMBULATORY_CARE_PROVIDER_SITE_OTHER): Payer: PRIVATE HEALTH INSURANCE | Admitting: Physician Assistant

## 2018-09-11 ENCOUNTER — Other Ambulatory Visit: Payer: Self-pay

## 2018-09-11 VITALS — BP 112/68 | HR 85 | Temp 98.5°F

## 2018-09-11 DIAGNOSIS — L0390 Cellulitis, unspecified: Secondary | ICD-10-CM

## 2018-09-11 DIAGNOSIS — B9562 Methicillin resistant Staphylococcus aureus infection as the cause of diseases classified elsewhere: Secondary | ICD-10-CM | POA: Insufficient documentation

## 2018-09-11 MED ORDER — CLINDAMYCIN HCL 300 MG PO CAPS: 300.0000 mg | ORAL_CAPSULE | Freq: Three times a day (TID) | ORAL | 0 refills | Status: AC
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
## 2018-09-11 NOTE — Patient Instructions (Signed)
MRSA Infection, Diagnosis, Adult Methicillin-resistant Staphylococcus aureus (MRSA) infection is caused by bacteria called Staphylococcus aureus, or staph, that no longer respond to common antibiotic medicines (drug-resistant bacteria). MRSA infection can be hard to treat. Most of the time, MRSA can be on the skin or in the nose without causing problems (colonized). However, if MRSA enters the body through a cut, a sore, or an invasive medical device, it can cause a serious infection. What are the causes? This condition is caused by staph bacteria. Illness may develop after exposure to the bacteria through:  Skin-to-skin contact with someone who is infected with MRSA.  Touching surfaces that have the bacteria on them.  Having a procedure or using equipment that allows MRSA to enter the body.  Having MRSA that lives on your skin and then enters your body through: ? A cut or scratch. ? A surgery or procedure. ? The use of a medical device. Contact with the bacteria may occur:  During a stay in a hospital, rehabilitation facility, nursing home, or other health care facility (health care-associated MRSA).  In daily activities where there is close contact with others, such as sports, child care centers, or at home (community-associated MRSA). What increases the risk? You are more likely to develop this condition if you:  Have a surgery or procedure.  Have an IV or a thin tube (catheter) placed in your body.  Are elderly.  Are on kidney dialysis.  Have recently taken an antibiotic medicine.  Live in a long-term care facility.  Have a chronic wound or skin ulcer.  Have a weak body defense system (immune system).  Play sports that involve skin-to-skin contact.  Live in a crowded place, like a dormitory or military barracks.  Share towels, razors, or sports equipment with other people.  Have a history of MRSA infection or colonization. What are the signs or symptoms? Symptoms  of this condition depend on the area that is affected. Symptoms may include:  A pus-filled pimple or boil.  Pus that drains from your skin.  A sore (abscess) under your skin or somewhere in your body.  Fever with or without chills.  Difficulty breathing.  Coughing up blood.  Redness, warmth, swelling, or pain in the affected area. How is this diagnosed? This condition may be diagnosed based on:  A physical exam.  Your medical history.  Taking a sample from the infected area and growing it in a lab (culture). You may also have other tests, including:  Imaging tests, such as X-rays, a CT scan, or an MRI.  Lab tests, such as blood, urine, or phlegm (sputum) tests. You skin or nose may be swabbed when you are admitted to a health care facility for a procedure. This is to screen for MRSA. How is this treated? Treatment depends on the type of MRSA infection you have and how severe, deep, or extensive it is. Treatment may include:  Antibiotic medicines.  Surgery to drain pus from the infected area. Severe infections may require a hospital stay. Follow these instructions at home: Medicines  Take over-the-counter and prescription medicines only as told by your health care provider.  If you were prescribed an antibiotic medicine, use it as told by your health care provider. Do not stop using the antibiotic even if you start to feel better. Prevention Follow these instructions to avoid spreading the infection to others:  Wash your hands frequently with soap and water. If soap and water are not available, use an alcohol-based hand sanitizer.    Avoid close contact with those around you as much as possible. Do not use towels, razors, toothbrushes, bedding, or other items that will be used by others.  Wash towels, bedding, and clothes in the washing machine with detergent and hot water. Dry them in a hot dryer.  Clean surfaces regularly to remove germs (disinfection). Use products  or solutions that contain bleach. Make sure you disinfect bathroom surfaces, food preparation areas, exercise equipment, and doorknobs.  General instructions  If you have a wound, follow instructions from your health care provider about how to take care of your wound. ? Do not pick at scabs. ? Do not try to drain any infection sites or pimples.  Tell all your health care providers that you have MRSA, or if you have ever had a MRSA infection.  Keep all follow-up visits as told by your health care provider. This is important. Contact a health care provider if you:  Do not get better.  Have symptoms that get worse.  Have new symptoms. Get help right away if you have:  Nausea or vomiting, or if you cannot take medicine without vomiting.  Trouble breathing.  Chest pain. These symptoms may represent a serious problem that is an emergency. Do not wait to see if the symptoms will go away. Get medical help right away. Call your local emergency services (911 in the [place] Do not drive yourself to the hospital. Summary  MRSA infection is caused by bacteria called Staphylococcus aureus, or staph, that no longer respond to common antibiotic medicines.  Treatment for this condition depends on the type of MRSA infection you have and how severe, deep, and extensive it is.  If you were prescribed an antibiotic medicine, use it as told by your health care provider. Do not stop using the antibiotic even if you start to feel better.  Follow instructions from your health care provider to avoid spreading the infection to others. This information is not intended to replace advice given to you by your health care provider. Make sure you discuss any questions you have with your health care provider. Document Released: 01/21/2005 Document Revised: 04/09/2018 Document Reviewed: 04/10/2018 Elsevier Patient Education  2020 [employer]   MRSA Infection, Self-Care, Adult Methicillin-resistant  Staphylococcus aureus (MRSA) infection is caused by bacteria that no longer respond to antibiotic medicines. MRSA infection can be hard to treat. Self-care is important after being diagnosed with MRSA. Following instructions for self-care will:  Help you heal well.  Prevent the infection from spreading to others. Your health care provider may also give you more specific instructions. If you have problems or questions, contact your health care provider. What are the risks? MRSA can be on the skin or in the nose of many people without causing problems. This is called MRSA colonization. However, MRSA can cause problems for you and others if it enters the body through a cut, a sore, or an invasive medical procedure or device.  If MRSA enters your body, it may cause serious problems, such as: ? Skin infections. ? Bone or joint infections. ? Pneumonia. ? Bloodstream infections (sepsis).  If you have these infections, MRSA may spread to others, including: ? Visitors or other patients in the hospital. ? Friends, family, or other people at home or in your community. Supplies needed:  Soap and water.  Germ-free (sterile) dressing.  Alcohol-based hand sanitizer (optional).  Home cleaning solutions that contain bleach.  Clean or disposable towels. How to prevent MRSA infections Take these steps to  avoid getting another MRSA infection and to prevent the bacteria from spreading to others. Hand washing   Wash your hands frequently with soap and water. If soap and water are not available, use an alcohol-based hand sanitizer. Dry your hands with a clean or disposable towel.  Make sure that everyone in the household washes their hands often. Wound care  If you have a wound, follow instructions from your health care provider about how to take care of your wound. Make sure you:  Wash your hands with soap and water before and after you change your bandage (dressing). If soap and water are not  available, use an alcohol-based hand sanitizer.  Change your dressing as told by your health care provider.  Leave any stitches (sutures), skin glue, or adhesive strips in place. These skin closures may need to be in place for 2 weeks or longer. If adhesive strip edges start to loosen and curl up, you may trim the loose edges. Do not remove adhesive strips completely unless your health care provider tells you to do that.  Clean wounds, cuts, and abrasions with soap and water and cover them with dry, sterile dressings until they heal.  Check your wound every day for signs of infection. Check for: ? More redness, swelling, or pain. ? More fluid or blood. ? Warmth. ? Pus or a bad smell.  If you have a wound that seems to be infected, ask your health care provider if a culture should be done for MRSA and other bacteria. Personal hygiene  Maintain good hygiene by bathing often and keeping your body clean.  Wash hands before preparing food.  Always shower after exercising.  Wash towels, bedding, and clothes in the washing machine with detergent and hot water. Dry them in a hot dryer. General tips  Take your antibiotic medicine as told by your health care provider. Take them only when absolutely necessary. Do not stop taking the antibiotic even if you start to feel better.  Avoid close contact with others as much as possible.  Do not use towels, razors, toothbrushes, bedding, or other items that will be used by others.  Clean surfaces regularly to remove germs (disinfection). Use products or solutions that contain bleach. Make sure you disinfect bathroom surfaces, food preparation areas, and doorknobs. Follow these instructions at home:  Take over-the-counter and prescription medicines only as told by your health care provider.  Tell all health care providers who care for you that you have MRSA or that you have had MRSA.  If you are breastfeeding, talk to your health care provider  about MRSA. You may be asked to temporarily stop breastfeeding.  If you have an invasive medical device, make sure that you know how to take care of it to prevent infection.  Keep all follow-up visits as told by your health care provider. This is important. Contact a health care provider if:  Your infection seems to be getting worse. Signs may include: ? Warmth, redness, or tenderness around your wound site. ? A red line that spreads from your infection site. ? A dark color in the area around your infection. ? Wound drainage that is tan, yellow, or green. ? A bad smell coming from your wound.  You have symptoms of a new MRSA infection: ? A pus-filled pimple. ? A boil on your skin. ? Pus draining from your skin. ? A sore (abscess) under your skin or somewhere in your body. ? Fever with or without chills. Get help right  away if:  You have trouble breathing.  You feel nauseous, you vomit, or you cannot take medicine without vomiting.  You have chest pain. These symptoms may represent a serious problem that is an emergency. Do not wait to see if the symptoms will go away. Get medical help right away. Call your local emergency services (911 in the [place] Do not drive yourself to the hospital. Summary  Self-care is important after being diagnosed with MRSA. This will help you heal well and prevent the infection from spreading to others.  Wash your hands often, avoid close contact with others, and avoid sharing towels, razors, toothbrushes, and bedding with other people. This will help prevent the infection from spreading to others.  If you are being treated for a MRSA infection, make sure to take over-the-counter and prescription medicines as told. Finish all antibiotic medicine even if you start to feel better.  If you have a wound, follow instructions from your health care provider about how to take care of it. Check your wound every day for signs of infection. This information is  not intended to replace advice given to you by your health care provider. Make sure you discuss any questions you have with your health care provider. Document Released: 04/26/2005 Document Revised: 04/09/2018 Document Reviewed: 04/10/2018 Elsevier Patient Education  2020 [employer]

## 2018-09-29 ENCOUNTER — Encounter: Payer: Self-pay | Admitting: Physician Assistant

## 2018-10-07 ENCOUNTER — Other Ambulatory Visit: Payer: Self-pay | Admitting: Physician Assistant

## 2018-10-07 DIAGNOSIS — F909 Attention-deficit hyperactivity disorder, unspecified type: Secondary | ICD-10-CM

## 2018-10-08 MED ORDER — AMPHETAMINE-DEXTROAMPHET ER 10 MG PO CP24: 10.0000 mg | ORAL_CAPSULE | Freq: Every day | ORAL | 0 refills | Status: DC

## 2018-10-08 MED ORDER — AMPHETAMINE-DEXTROAMPHETAMINE 10 MG PO TABS: 10.0000 mg | ORAL_TABLET | Freq: Every day | ORAL | 0 refills | Status: DC

## 2018-10-22 ENCOUNTER — Encounter: Payer: Self-pay | Admitting: Physician Assistant

## 2018-10-22 ENCOUNTER — Ambulatory Visit (INDEPENDENT_AMBULATORY_CARE_PROVIDER_SITE_OTHER): Payer: PRIVATE HEALTH INSURANCE | Admitting: Physician Assistant

## 2018-10-22 ENCOUNTER — Other Ambulatory Visit: Payer: Self-pay

## 2018-10-22 VITALS — BP 132/77 | HR 90 | Temp 98.0°F | Wt 239.0 lb

## 2018-10-22 DIAGNOSIS — F17210 Nicotine dependence, cigarettes, uncomplicated: Secondary | ICD-10-CM

## 2018-10-22 DIAGNOSIS — F909 Attention-deficit hyperactivity disorder, unspecified type: Secondary | ICD-10-CM

## 2018-10-22 DIAGNOSIS — I10 Essential (primary) hypertension: Secondary | ICD-10-CM | POA: Diagnosis not present

## 2018-10-22 MED ORDER — AMPHETAMINE-DEXTROAMPHETAMINE 10 MG PO TABS: 10.0000 mg | ORAL_TABLET | Freq: Every day | ORAL | 0 refills | Status: DC

## 2018-10-22 MED ORDER — AMPHETAMINE-DEXTROAMPHET ER 10 MG PO CP24: 10.0000 mg | ORAL_CAPSULE | Freq: Every day | ORAL | 0 refills | Status: DC
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
## 2018-11-05 ENCOUNTER — Ambulatory Visit: Payer: PRIVATE HEALTH INSURANCE | Admitting: Physician Assistant

## 2018-12-24 ENCOUNTER — Other Ambulatory Visit: Payer: Self-pay | Admitting: Physician Assistant

## 2018-12-24 ENCOUNTER — Other Ambulatory Visit: Payer: Self-pay | Admitting: Osteopathic Medicine

## 2018-12-24 DIAGNOSIS — F909 Attention-deficit hyperactivity disorder, unspecified type: Secondary | ICD-10-CM

## 2018-12-24 MED ORDER — AMPHETAMINE-DEXTROAMPHETAMINE 10 MG PO TABS: 10.0000 mg | ORAL_TABLET | Freq: Every day | ORAL | 0 refills | Status: DC

## 2018-12-24 MED ORDER — AMPHETAMINE-DEXTROAMPHET ER 10 MG PO CP24: 10.0000 mg | ORAL_CAPSULE | Freq: Every day | ORAL | 0 refills | Status: DC

## 2018-12-24 NOTE — Telephone Encounter (Signed)
Ok to refill but pt needs to keep apt or no further refills will be authorized by me!

## 2018-12-24 NOTE — Telephone Encounter (Signed)
Patient is aware and did not have any questions. He stated " I will keep the appointment" .

## 2018-12-24 NOTE — Telephone Encounter (Signed)
Patient has a follow up on 01/21/2019. Please advise.

## 2019-01-21 ENCOUNTER — Other Ambulatory Visit: Payer: Self-pay

## 2019-01-21 ENCOUNTER — Ambulatory Visit (INDEPENDENT_AMBULATORY_CARE_PROVIDER_SITE_OTHER): Payer: PRIVATE HEALTH INSURANCE | Admitting: Osteopathic Medicine

## 2019-01-21 ENCOUNTER — Encounter: Payer: Self-pay | Admitting: Osteopathic Medicine

## 2019-01-21 DIAGNOSIS — F909 Attention-deficit hyperactivity disorder, unspecified type: Secondary | ICD-10-CM | POA: Diagnosis not present

## 2019-01-21 MED ORDER — AMPHETAMINE-DEXTROAMPHET ER 20 MG PO CP24: 20.0000 mg | ORAL_CAPSULE | Freq: Every day | ORAL | 0 refills | Status: DC

## 2019-01-21 MED ORDER — AMPHETAMINE-DEXTROAMPHETAMINE 10 MG PO TABS: 10.0000 mg | ORAL_TABLET | Freq: Every day | ORAL | 0 refills | Status: DC

## 2019-01-21 MED ORDER — IBUPROFEN 800 MG PO TABS: 800.0000 mg | ORAL_TABLET | Freq: Three times a day (TID) | ORAL | 1 refills | Status: DC | PRN
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
## 2019-02-09 ENCOUNTER — Ambulatory Visit (INDEPENDENT_AMBULATORY_CARE_PROVIDER_SITE_OTHER): Payer: PRIVATE HEALTH INSURANCE | Admitting: Medical-Surgical

## 2019-02-09 ENCOUNTER — Other Ambulatory Visit: Payer: Self-pay

## 2019-02-09 ENCOUNTER — Encounter: Payer: Self-pay | Admitting: Medical-Surgical

## 2019-02-09 VITALS — BP 125/71 | HR 86 | Temp 98.1°F | Ht 71.0 in | Wt 236.1 lb

## 2019-02-09 DIAGNOSIS — L03113 Cellulitis of right upper limb: Secondary | ICD-10-CM

## 2019-02-09 MED ORDER — CLINDAMYCIN HCL 150 MG PO CAPS: 450.0000 mg | ORAL_CAPSULE | Freq: Three times a day (TID) | ORAL | 0 refills | Status: AC
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
## 2019-02-11 ENCOUNTER — Encounter: Payer: Self-pay | Admitting: Medical-Surgical

## 2019-02-11 ENCOUNTER — Ambulatory Visit (INDEPENDENT_AMBULATORY_CARE_PROVIDER_SITE_OTHER): Payer: PRIVATE HEALTH INSURANCE | Admitting: Medical-Surgical

## 2019-02-11 DIAGNOSIS — L03113 Cellulitis of right upper limb: Secondary | ICD-10-CM

## 2019-02-11 IMAGING — DX DG HAND COMPLETE 3+V*L*
3 series · 3 of 3 positions shown · non-contrast
Comparison: Radiographs 07/11/2012

CLINICAL DATA: Injured hand.

EXAM:
LEFT HAND - COMPLETE 3+ VIEW

[hand pa]
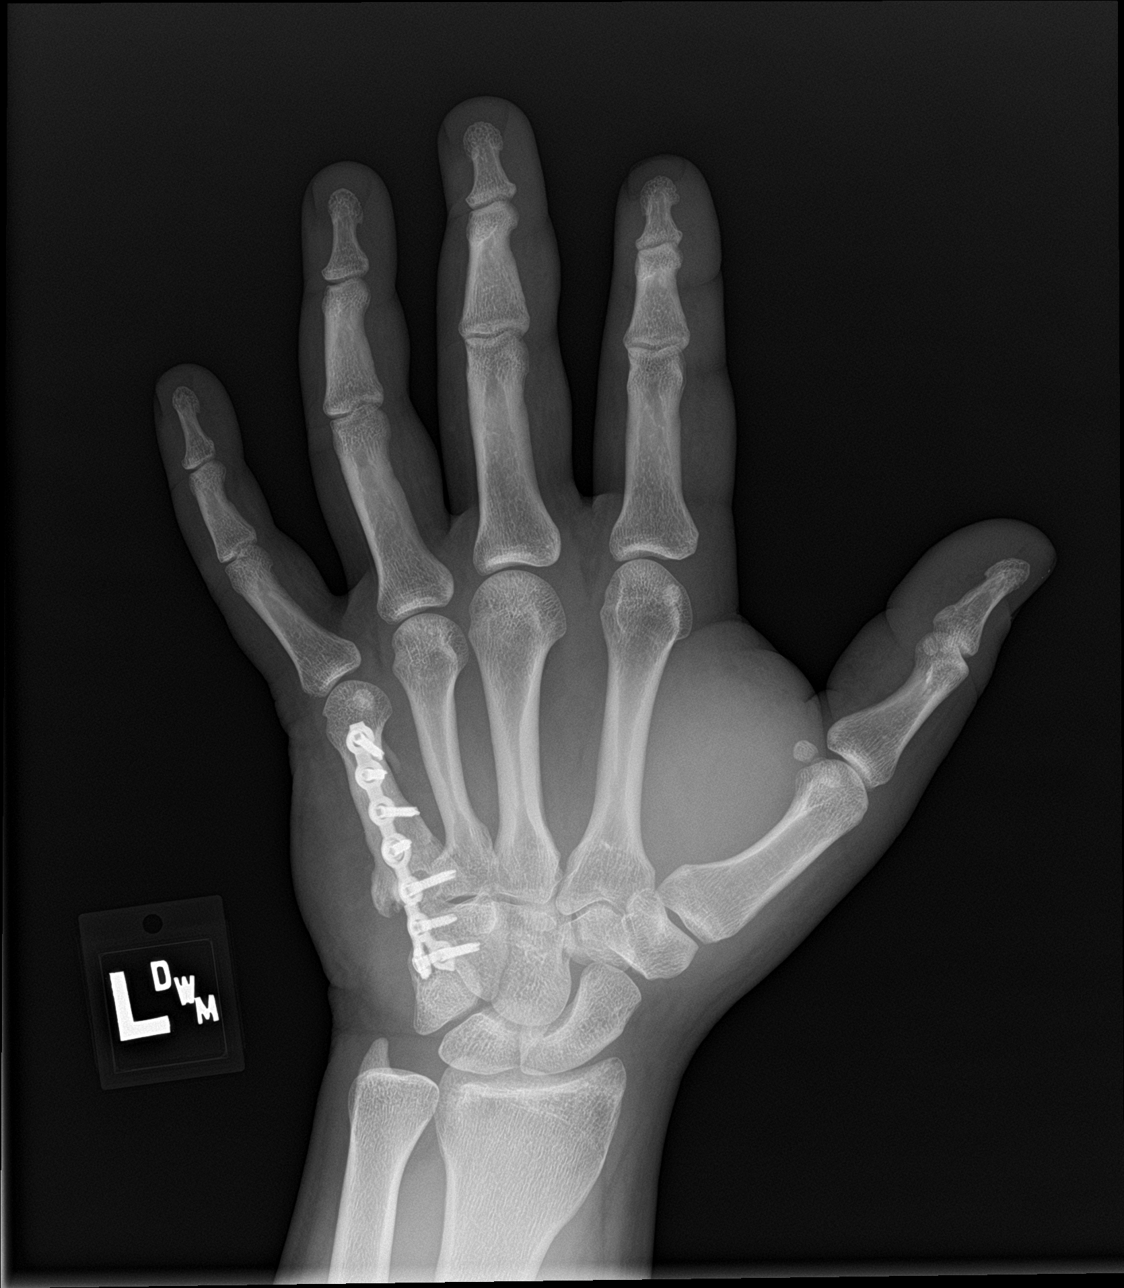

[hand obl]
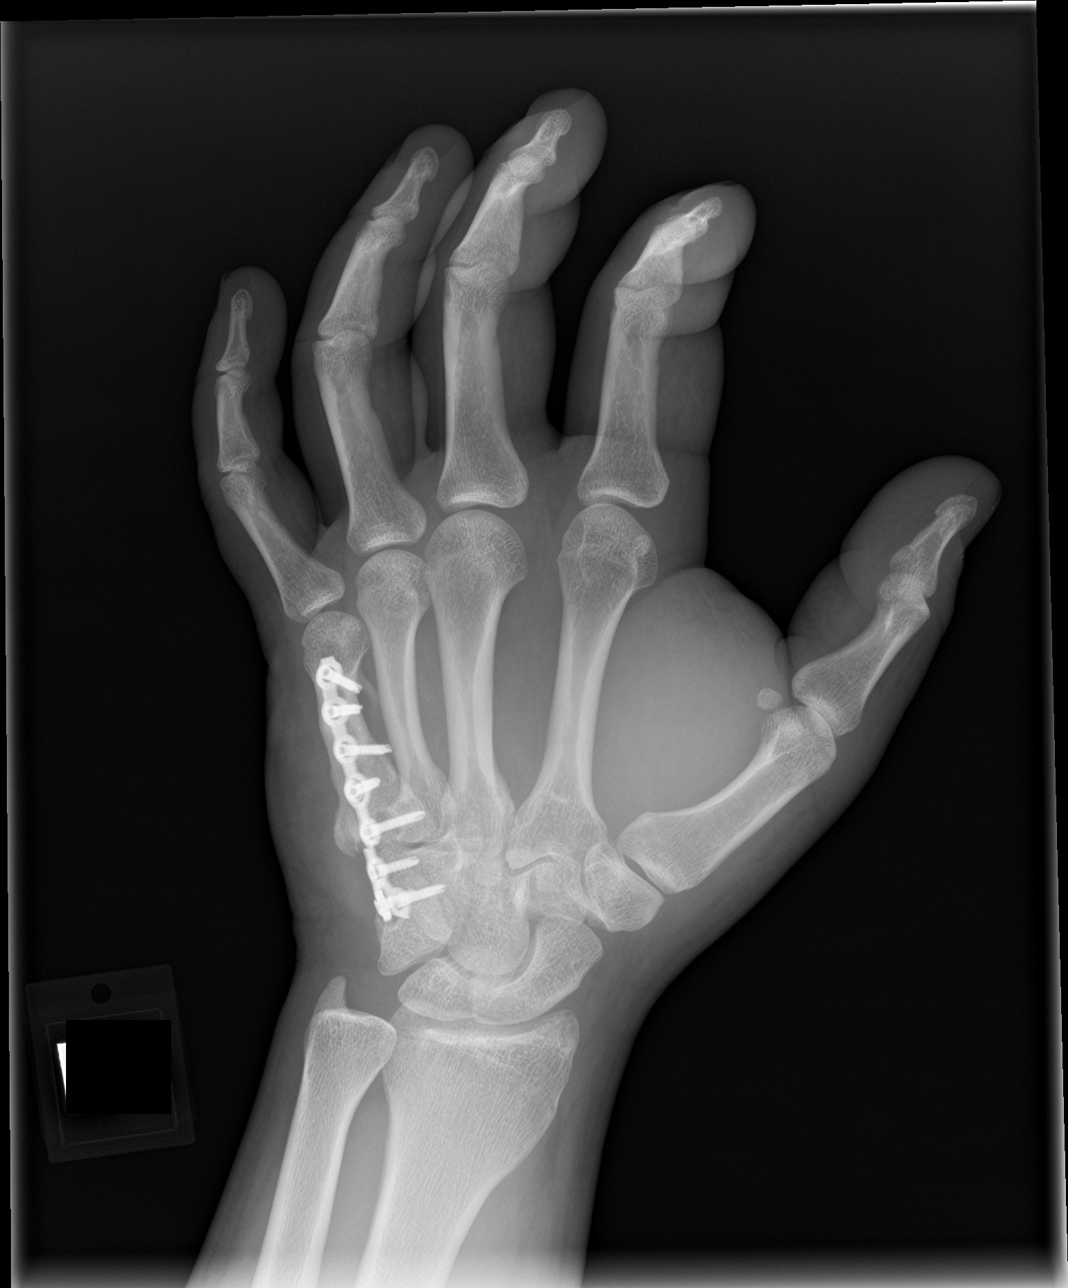

[hand lat]
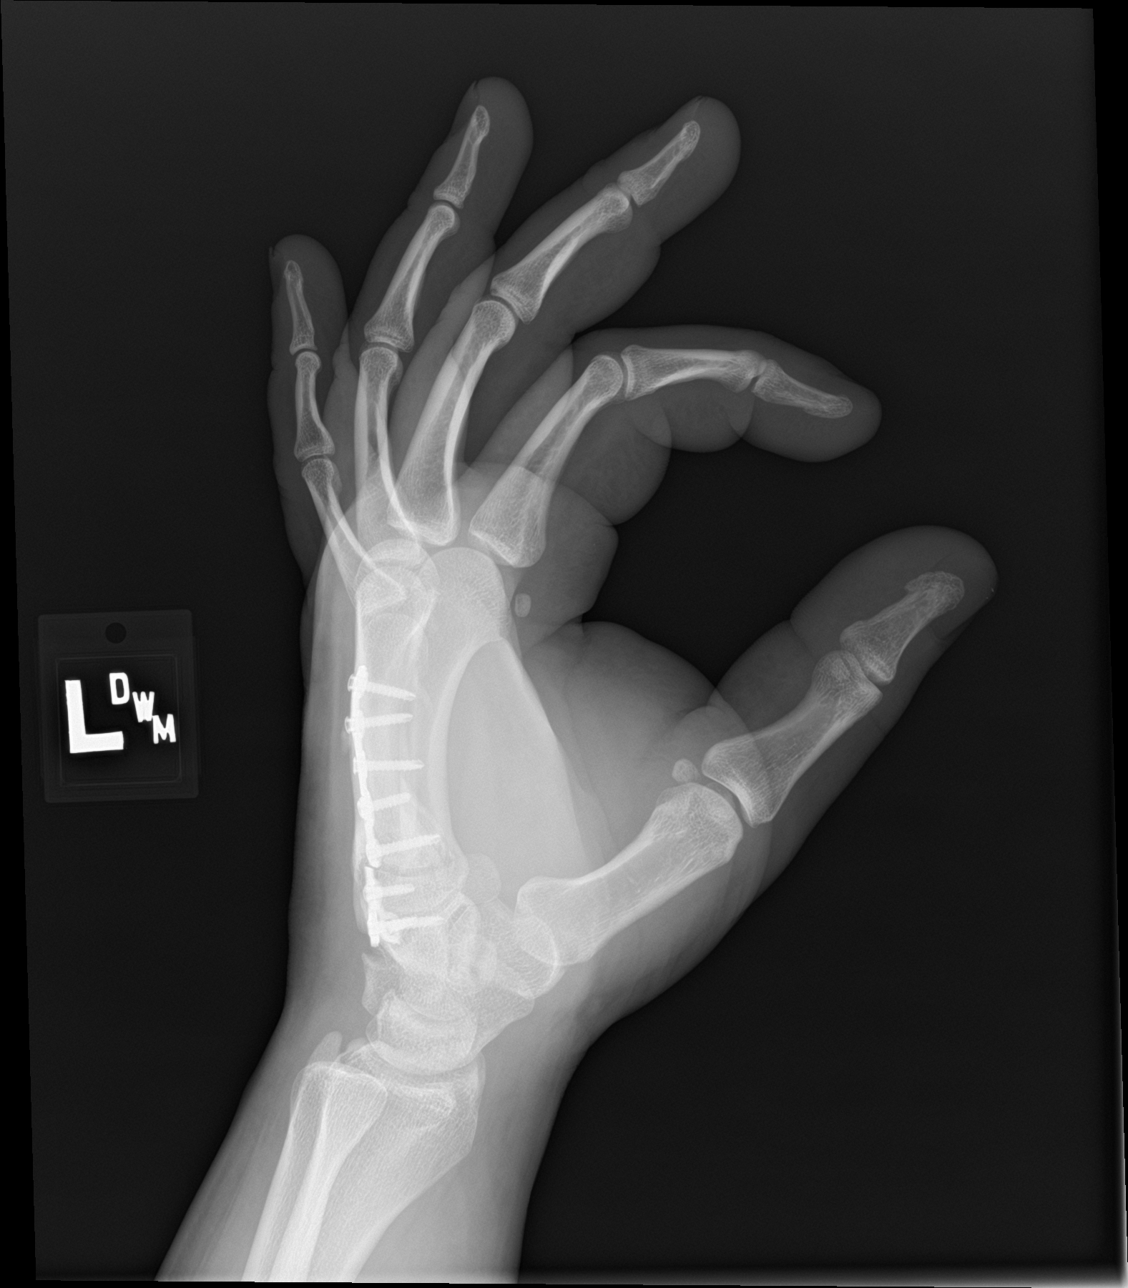

[3 of 3 positions shown; findings below may reference images not displayed]

FINDINGS: As demonstrated on the wrist films there is remote fixation hardware
along the ulnar aspect of the hand and wrist and the plate is
fractured at the fifth carpometacarpal joint.

No acute hand fracture is identified. There is prominence and
increased density involving the thenar musculature between the thumb
and second metacarpal suspicious for a hematoma.
IMPRESSION: 1. Remote fixation hardware as above.
2. No acute wrist or hand fracture.
3. Suspect soft tissue injury between the first and second
metacarpals, likely hematoma.

## 2019-02-11 NOTE — Progress Notes (Addendum)
Virtual Visit via Video Note  I connected with Edwin Williams on 02/11/19 at  1:00 PM EST by a video enabled telemedicine application and verified that I am speaking with the correct person using two identifiers.   I discussed the limitations of evaluation and management by telemedicine and the availability of in person appointments. The patient expressed understanding and agreed to proceed.  Subjective:    CC: Right elbow cellulitis follow-up  HPI:  34 year old male presenting today to follow-up on right elbow cellulitis.  Has taken 4 doses of 450 mg clindamycin.  Reports that the redness and swelling has not worsened but does not seem to have gotten a lot better.  The site is now draining small amounts of clear fluid.  Reports induration at the site of redness but no areas that feel like there is fluid underneath.  Continued edema at the elbow.  Denies fever, chills, myalgias, and purulent drainage.   Past medical history, Surgical history, Family history not pertinant except as noted below, Social history, Allergies, and medications have been entered into the medical record, reviewed, and corrections made.   Review of Systems: No fevers, chills, night sweats, weight loss, chest pain, or shortness of breath.   Objective:    General: Speaking clearly in complete sentences without any shortness of breath.  Alert and oriented x3.  Normal judgment. No apparent acute distress.   Previously marked sharpie lines still present.  Reduction in redness noted along distal edges of markings.  Previously noted pinpoint scab replaced by small break in skin.  Unable to measure or capture image due to virtual nature of appointment.    Impression and Recommendations:    Cellulitis of right upper extremity   Continue clindamycin 450 mg 3 times daily for remaining course.  Consider keeping covered to contain drainage and prevent scratching/picking.  Monitor drainage for any changes in color or  consistency.  Continue to monitor site for development of a fluid pocket that may need to be drained.  If no resolution, will need an office evaluation for possible I&D.  I discussed the assessment and treatment plan with the patient. The patient was provided an opportunity to ask questions and all were answered. The patient agreed with the plan and demonstrated an understanding of the instructions.   The patient was advised to call back or seek an in-person evaluation if the symptoms worsen or if the condition fails to improve as anticipated.  Return if symptoms worsen or fail to improve.  20 minutes of non-face-to-face time was provided during this encounter.  Thayer Ohm, DNP, APRN, FNP-BC [facility] and Sports Medicine

## 2019-02-19 ENCOUNTER — Encounter: Payer: Self-pay | Admitting: Sports Medicine

## 2019-02-19 ENCOUNTER — Other Ambulatory Visit: Payer: Self-pay

## 2019-02-19 ENCOUNTER — Telehealth: Payer: Self-pay | Admitting: Physician Assistant

## 2019-02-19 ENCOUNTER — Ambulatory Visit (INDEPENDENT_AMBULATORY_CARE_PROVIDER_SITE_OTHER): Payer: PRIVATE HEALTH INSURANCE | Admitting: Sports Medicine

## 2019-02-19 DIAGNOSIS — L0390 Cellulitis, unspecified: Secondary | ICD-10-CM

## 2019-02-19 DIAGNOSIS — F909 Attention-deficit hyperactivity disorder, unspecified type: Secondary | ICD-10-CM

## 2019-02-19 DIAGNOSIS — B9562 Methicillin resistant Staphylococcus aureus infection as the cause of diseases classified elsewhere: Secondary | ICD-10-CM | POA: Diagnosis not present

## 2019-02-19 MED ORDER — DOXYCYCLINE HYCLATE 100 MG PO TABS: 100.0000 mg | ORAL_TABLET | Freq: Two times a day (BID) | ORAL | 0 refills | Status: AC

## 2019-02-19 NOTE — Progress Notes (Signed)
    Procedures performed today:    None.  Independent interpretation of tests performed by another provider:   None.  Impression and Recommendations:    Cellulitis due to MRSA This is a pleasant 34 year old male, he had a right elbow cellulitis, treated quite appropriately by Christen Butter, NP. He has done well with clindamycin has noted good improvement. Cultures were obtained that grew out MRSA, sensitive to doxycycline and clindamycin. He still has a bit of erythema and a palpable nodule that feels like a sebaceous cyst. I am going to add another course of antibiotics, a week of doxycycline this time. He will continue warm compresses. I would like to see him back in about 2 weeks, at that point we will consider doing a surgical excision if I can still feel a palpable nodule under his skin. He also has what feels to be a sebaceous cyst on his left low back that we can remove at a future date as well.   ___________________________________________ Ihor Austin. Benjamin Stain, M.D., ABFM., CAQSM. Primary Care and Sports Medicine [facility]  Adjunct Instructor of Family Medicine  [employer]

## 2019-02-19 NOTE — Telephone Encounter (Signed)
Pt states he seen Dr.Alexander last for his Adderall and she was requesting that he call back to let her know if dosage is working well and she would refill . Please let patient know when this has been called in

## 2019-02-19 NOTE — Assessment & Plan Note (Signed)
This is a pleasant 34 year old male, he had a right elbow cellulitis, treated quite appropriately by Christen Butter, NP. He has done well with clindamycin has noted good improvement. Cultures were obtained that grew out MRSA, sensitive to doxycycline and clindamycin. He still has a bit of erythema and a palpable nodule that feels like a sebaceous cyst. I am going to add another course of antibiotics, a week of doxycycline this time. He will continue warm compresses. I would like to see him back in about 2 weeks, at that point we will consider doing a surgical excision if I can still feel a palpable nodule under his skin. He also has what feels to be a sebaceous cyst on his left low back that we can remove at a future date as well.

## 2019-02-20 MED ORDER — AMPHETAMINE-DEXTROAMPHET ER 20 MG PO CP24: 20.0000 mg | ORAL_CAPSULE | Freq: Every day | ORAL | 0 refills | Status: DC

## 2019-02-20 MED ORDER — AMPHETAMINE-DEXTROAMPHETAMINE 10 MG PO TABS: 10.0000 mg | ORAL_TABLET | Freq: Every day | ORAL | 0 refills | Status: DC

## 2019-02-20 NOTE — Telephone Encounter (Signed)
Sent!

## 2019-02-22 ENCOUNTER — Ambulatory Visit: Payer: PRIVATE HEALTH INSURANCE | Admitting: Medical-Surgical

## 2019-02-22 NOTE — Telephone Encounter (Signed)
I called patient and he is aware and did not have any questions.

## 2019-03-09 ENCOUNTER — Ambulatory Visit (INDEPENDENT_AMBULATORY_CARE_PROVIDER_SITE_OTHER): Payer: PRIVATE HEALTH INSURANCE | Admitting: Sports Medicine

## 2019-03-09 ENCOUNTER — Other Ambulatory Visit: Payer: Self-pay

## 2019-03-09 DIAGNOSIS — B9562 Methicillin resistant Staphylococcus aureus infection as the cause of diseases classified elsewhere: Secondary | ICD-10-CM

## 2019-03-09 DIAGNOSIS — L0390 Cellulitis, unspecified: Secondary | ICD-10-CM | POA: Diagnosis not present

## 2019-03-09 NOTE — Assessment & Plan Note (Signed)
Burton is a pleasant 34 year old male, who returns, we have been treating for right elbow cellulitis. Initially it was treated with clindamycin, improved, cultures obtained grew out MRSA. I switched him to doxycycline and he returns today with symptoms essentially resolved. I no longer feel any palpable nodules so there is nothing to surgically excise. He also has a sebaceous cyst on his left low back, it is asymptomatic and I have advised him that we are wise to just leave it alone for now. Return as needed.

## 2019-03-09 NOTE — Progress Notes (Signed)
    Procedures performed today:    None.  Independent interpretation of tests performed by another provider:   None.  Impression and Recommendations:    Cellulitis due to MRSA Edwin Williams is a pleasant 34 year old male, who returns, we have been treating for right elbow cellulitis. Initially it was treated with clindamycin, improved, cultures obtained grew out MRSA. I switched him to doxycycline and he returns today with symptoms essentially resolved. I no longer feel any palpable nodules so there is nothing to surgically excise. He also has a sebaceous cyst on his left low back, it is asymptomatic and I have advised him that we are wise to just leave it alone for now. Return as needed.    ___________________________________________ Ihor Austin. Benjamin Stain, M.D., ABFM., CAQSM. Primary Care and Sports Medicine [facility]  Adjunct Instructor of Family Medicine  [employer]

## 2019-03-29 ENCOUNTER — Encounter: Payer: Self-pay | Admitting: Osteopathic Medicine

## 2019-03-29 DIAGNOSIS — F909 Attention-deficit hyperactivity disorder, unspecified type: Secondary | ICD-10-CM

## 2019-03-29 MED ORDER — AMPHETAMINE-DEXTROAMPHETAMINE 10 MG PO TABS: 10.0000 mg | ORAL_TABLET | Freq: Every day | ORAL | 0 refills | Status: DC

## 2019-03-29 MED ORDER — AMPHETAMINE-DEXTROAMPHET ER 20 MG PO CP24: 20.0000 mg | ORAL_CAPSULE | Freq: Every day | ORAL | 0 refills | Status: DC

## 2019-03-29 NOTE — Telephone Encounter (Signed)
Last OV 01/21/19  Last RX sent 02/21/19  RXs pended

## 2019-04-22 ENCOUNTER — Other Ambulatory Visit: Payer: Self-pay | Admitting: Family Medicine

## 2019-04-22 DIAGNOSIS — F909 Attention-deficit hyperactivity disorder, unspecified type: Secondary | ICD-10-CM

## 2019-04-23 MED ORDER — AMPHETAMINE-DEXTROAMPHETAMINE 10 MG PO TABS: 10.0000 mg | ORAL_TABLET | Freq: Every day | ORAL | 0 refills | Status: DC

## 2019-04-23 MED ORDER — AMPHETAMINE-DEXTROAMPHET ER 20 MG PO CP24: 20.0000 mg | ORAL_CAPSULE | Freq: Every day | ORAL | 0 refills | Status: DC

## 2019-04-23 NOTE — Telephone Encounter (Signed)
[facility] pharmacy requesting med refill for amphetamine-dextro 10 mg & 20 mg.

## 2019-05-03 ENCOUNTER — Encounter: Payer: Self-pay | Admitting: Osteopathic Medicine

## 2019-05-05 ENCOUNTER — Encounter: Payer: Self-pay | Admitting: Osteopathic Medicine

## 2019-05-05 ENCOUNTER — Other Ambulatory Visit: Payer: Self-pay

## 2019-05-05 ENCOUNTER — Ambulatory Visit (INDEPENDENT_AMBULATORY_CARE_PROVIDER_SITE_OTHER): Payer: PRIVATE HEALTH INSURANCE | Admitting: Osteopathic Medicine

## 2019-05-05 ENCOUNTER — Ambulatory Visit (INDEPENDENT_AMBULATORY_CARE_PROVIDER_SITE_OTHER): Payer: PRIVATE HEALTH INSURANCE

## 2019-05-05 VITALS — BP 115/71 | HR 98 | Temp 98.1°F | Wt 232.0 lb

## 2019-05-05 DIAGNOSIS — S6990XA Unspecified injury of unspecified wrist, hand and finger(s), initial encounter: Secondary | ICD-10-CM | POA: Diagnosis not present

## 2019-05-05 DIAGNOSIS — F909 Attention-deficit hyperactivity disorder, unspecified type: Secondary | ICD-10-CM

## 2019-05-05 DIAGNOSIS — Z8614 Personal history of Methicillin resistant Staphylococcus aureus infection: Secondary | ICD-10-CM | POA: Diagnosis not present

## 2019-05-05 MED ORDER — HYDROCODONE-ACETAMINOPHEN 5-325 MG PO TABS: 1.0000 | ORAL_TABLET | Freq: Three times a day (TID) | ORAL | 0 refills | Status: AC | PRN

## 2019-05-05 MED ORDER — AMPHETAMINE-DEXTROAMPHETAMINE 10 MG PO TABS: 10.0000 mg | ORAL_TABLET | Freq: Every day | ORAL | 0 refills | Status: DC

## 2019-05-05 MED ORDER — CLINDAMYCIN HCL 300 MG PO CAPS: 300.0000 mg | ORAL_CAPSULE | Freq: Three times a day (TID) | ORAL | 0 refills | Status: DC

## 2019-05-05 MED ORDER — AMPHETAMINE-DEXTROAMPHET ER 20 MG PO CP24: 20.0000 mg | ORAL_CAPSULE | Freq: Every day | ORAL | 0 refills | Status: DC

## 2019-05-05 NOTE — Progress Notes (Addendum)
Edwin Williams is a 34 y.o. male who presents to  [place] at [facility]  today, 05/05/19, seeking care for the following: . [place] with other pertinent history/findings:  The primary encounter diagnosis was Finger injury, initial encounter. A diagnosis of History of MRSA infection was also pertinent to this visit.  Marland Kitchen Splinter in finger while caulking something. Thinks he got most of it out but feels painful so not sure. On exam, very difficult to tell even under magnification. L 5th digit is swollen distal to the DIP.  Marland Kitchen XR personally reviewed - I can't appreciate any FB, report agrees.    Appreciate curbside consult with Dr Dianah Field, who also reviewed Xray and photos below.  . Records reviewed: skin culture (+)MRSA (S)Clindamycin 09/2018 . --> Abx, pain Rx, wound culture (scant pus drainage), would have very low threshold for referral to hand surgeon! Swelling is distal to the joint, if he does well on abx overnight I think ok to hold off on referral but ER precautions were reviewed in detail with the patient! Reminder set to call him in the AM.           There are no Patient Instructions on file for this visit.   Orders Placed This Encounter  Procedures  . DG Finger Little Right   DG Finger Little Right  Result Date: 05/05/2019 CLINICAL DATA:  Pain distal aspect fifth digit, concern for foreign body EXAM: RIGHT LITTLE FINGER 2+V COMPARISON:  02/18/2016 FINDINGS: Frontal, oblique, and lateral views of the right fifth digit are obtained. No acute displaced fractures. Site spaces are well preserved. There is diffuse soft tissue edema. No radiopaque foreign bodies. IMPRESSION: 1. No fracture or radiopaque foreign body. Electronically Signed   By: Randa Ngo M.D.   On: 05/05/2019 15:26    No orders of the defined types were placed in this encounter.      ADDENDUM 05/06/19 5:35 PM  Called and  spoke to patient, he states that there is really no purulent drainage at this point, still some reddish clear drainage.  Still a bit swollen.  Not really any worse.  Pain is a little bit better since starting the antibiotics.  Reiterated that if worse, would definitely get seen at the hospital.  If no better over the next few days, would also probably go get checked out.  Patient is agreeable to plan.    Follow-up instructions: Return if symptoms worsen or fail to improve.                                         BP 115/71 (BP Location: Left Arm, Patient Position: Sitting, Cuff Size: Large)   Pulse 98   Temp 98.1 F (36.7 C) (Oral)   Wt 232 lb 0.6 oz (105.3 kg)   BMI 32.36 kg/m   Current Meds  Medication Sig  . amLODipine (NORVASC) 5 MG tablet Take 1 tablet (5 mg total) by mouth daily.  Marland Kitchen amphetamine-dextroamphetamine (ADDERALL XR) 20 MG 24 hr capsule Take 1 capsule (20 mg total) by mouth daily.  Marland Kitchen amphetamine-dextroamphetamine (ADDERALL) 10 MG tablet Take 1 tablet (10 mg total) by mouth daily after lunch.  . ibuprofen (ADVIL) 800 MG tablet Take 1 tablet (800 mg total) by mouth every 8 (eight) hours as needed.  . Nicotine Polacrilex (TGT NICOTINE  MT) Use as directed 4-8 mg in the mouth or throat every 2 (two) hours as needed.    No results found for this or any previous visit (from the past 72 hour(s)).  No results found.  Depression screen [facility] 2/9 06/05/2018 11/11/2016  Decreased Interest 0 0  Down, Depressed, Hopeless 0 0  PHQ - 2 Score 0 0    No flowsheet data found.    All questions at time of visit were answered - patient instructed to contact office with any additional concerns or updates.  ER/RTC precautions were reviewed with the patient.  Please note: voice recognition software was used to produce this document, and typos may escape review. Please contact Dr. Lyn Hollingshead for any needed clarifications.

## 2019-05-05 NOTE — Patient Instructions (Signed)
If swelling gets worse, doing down toward the hand, more pus from the wound, fever, or joint pain/stiffness down the finger, GO TO THE ER!

## 2019-05-06 MED ORDER — IBUPROFEN 800 MG PO TABS: 800.0000 mg | ORAL_TABLET | Freq: Three times a day (TID) | ORAL | 1 refills | Status: DC | PRN

## 2019-05-06 NOTE — Addendum Note (Signed)
Addended by: Deirdre Pippins on: 05/06/2019 05:36 PM   Modules accepted: Orders

## 2019-05-08 ENCOUNTER — Encounter: Payer: Self-pay | Admitting: Osteopathic Medicine

## 2019-05-08 LAB — WOUND CULTURE
MICRO NUMBER:: 10313781
RESULT:: NO GROWTH
SPECIMEN QUALITY:: ADEQUATE

## 2019-05-24 ENCOUNTER — Other Ambulatory Visit: Payer: Self-pay | Admitting: Osteopathic Medicine

## 2019-05-24 ENCOUNTER — Encounter: Payer: Self-pay | Admitting: Osteopathic Medicine

## 2019-05-24 DIAGNOSIS — F909 Attention-deficit hyperactivity disorder, unspecified type: Secondary | ICD-10-CM

## 2019-05-25 NOTE — Telephone Encounter (Signed)
Pt needs to contact pharmacy, Rx sent 05/23/19

## 2019-07-07 ENCOUNTER — Other Ambulatory Visit: Payer: Self-pay | Admitting: Osteopathic Medicine

## 2019-07-07 DIAGNOSIS — F909 Attention-deficit hyperactivity disorder, unspecified type: Secondary | ICD-10-CM

## 2019-07-07 MED ORDER — AMPHETAMINE-DEXTROAMPHET ER 20 MG PO CP24: 20.0000 mg | ORAL_CAPSULE | Freq: Every day | ORAL | 0 refills | Status: DC

## 2019-07-07 MED ORDER — AMPHETAMINE-DEXTROAMPHETAMINE 10 MG PO TABS: 10.0000 mg | ORAL_TABLET | Freq: Every day | ORAL | 0 refills | Status: DC

## 2019-07-07 NOTE — Telephone Encounter (Signed)
[facility] pharmacy requesting med refills for amphetamine-dex 10 mg & 20 mg.

## 2019-07-26 ENCOUNTER — Other Ambulatory Visit: Payer: Self-pay

## 2019-07-26 ENCOUNTER — Emergency Department
Admission: EM | Admit: 2019-07-26 | Discharge: 2019-07-26 | Disposition: A | Discharge status: Home / Self Care | Payer: PRIVATE HEALTH INSURANCE | Source: Home / Self Care

## 2019-07-26 ENCOUNTER — Encounter: Payer: Self-pay | Admitting: Emergency Medicine

## 2019-07-26 ENCOUNTER — Other Ambulatory Visit (HOSPITAL_COMMUNITY)
Admission: RE | Admit: 2019-07-26 | Discharge: 2019-07-26 | Disposition: A | Discharge status: Home / Self Care | Payer: PRIVATE HEALTH INSURANCE | Source: Ambulatory Visit | Attending: Family Medicine | Admitting: Family Medicine

## 2019-07-26 DIAGNOSIS — R300 Dysuria: Secondary | ICD-10-CM | POA: Diagnosis present

## 2019-07-26 DIAGNOSIS — R369 Urethral discharge, unspecified: Secondary | ICD-10-CM

## 2019-07-26 DIAGNOSIS — R8290 Unspecified abnormal findings in urine: Secondary | ICD-10-CM | POA: Diagnosis not present

## 2019-07-26 DIAGNOSIS — R82998 Other abnormal findings in urine: Secondary | ICD-10-CM | POA: Diagnosis not present

## 2019-07-26 LAB — POCT URINALYSIS DIP (MANUAL ENTRY)
Bilirubin, UA: NEGATIVE
Blood, UA: NEGATIVE
Glucose, UA: NEGATIVE mg/dL
Ketones, POC UA: NEGATIVE mg/dL
Nitrite, UA: NEGATIVE
Protein Ur, POC: NEGATIVE mg/dL
Spec Grav, UA: >=1.03 — AB (ref 1.010–1.025)
Urobilinogen, UA: 0.2 E.U./dL
pH, UA: 5.5 (ref 5.0–8.0)

## 2019-07-26 MED ORDER — DOXYCYCLINE HYCLATE 100 MG PO CAPS: 100.0000 mg | ORAL_CAPSULE | Freq: Two times a day (BID) | ORAL | 0 refills | Status: DC
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
## 2019-07-26 NOTE — Discharge Instructions (Signed)
  Please take your antibiotic as prescribed. A urine culture has been sent to check the severity of your urinary infection and to determine if you are on the most appropriate antibiotic. The results should come back within 2-3 days. You will only be notified if a medication change is indicated.  Please follow up with family medicine or urology if not improving within 1 week, sooner if symptoms worsening.   

## 2019-07-26 NOTE — ED Triage Notes (Signed)
Dysuria, burning on uriniation, slight discharge x 1 week denies new sexual partners

## 2019-07-27 LAB — URINE CULTURE
MICRO NUMBER:: 10613986
Result:: NO GROWTH
SPECIMEN QUALITY:: ADEQUATE

## 2019-07-27 LAB — GC/CHLAMYDIA PROBE AMP (~~LOC~~) NOT AT ARMC
Chlamydia: NEGATIVE
Comment: NEGATIVE
Comment: NORMAL
Neisseria Gonorrhea: NEGATIVE

## 2019-08-02 ENCOUNTER — Encounter: Payer: Self-pay | Admitting: Osteopathic Medicine

## 2019-08-05 ENCOUNTER — Other Ambulatory Visit: Payer: Self-pay | Admitting: Osteopathic Medicine

## 2019-08-05 ENCOUNTER — Encounter: Payer: Self-pay | Admitting: Medical-Surgical

## 2019-08-05 ENCOUNTER — Other Ambulatory Visit: Payer: Self-pay

## 2019-08-05 ENCOUNTER — Ambulatory Visit (INDEPENDENT_AMBULATORY_CARE_PROVIDER_SITE_OTHER): Payer: PRIVATE HEALTH INSURANCE | Admitting: Medical-Surgical

## 2019-08-05 VITALS — BP 132/80 | HR 83 | Temp 97.9°F | Ht 71.0 in | Wt 226.1 lb

## 2019-08-05 DIAGNOSIS — L245 Irritant contact dermatitis due to other chemical products: Secondary | ICD-10-CM | POA: Diagnosis not present

## 2019-08-05 DIAGNOSIS — F909 Attention-deficit hyperactivity disorder, unspecified type: Secondary | ICD-10-CM

## 2019-08-05 MED ORDER — CEPHALEXIN 500 MG PO CAPS: 500.0000 mg | ORAL_CAPSULE | Freq: Two times a day (BID) | ORAL | 0 refills | Status: DC

## 2019-08-05 MED ORDER — MUPIROCIN 2 % EX OINT: TOPICAL_OINTMENT | CUTANEOUS | 3 refills | Status: DC

## 2019-08-05 MED ORDER — AMPHETAMINE-DEXTROAMPHETAMINE 10 MG PO TABS: 10.0000 mg | ORAL_TABLET | Freq: Every day | ORAL | 0 refills | Status: DC

## 2019-08-05 MED ORDER — AMPHETAMINE-DEXTROAMPHET ER 20 MG PO CP24: 20.0000 mg | ORAL_CAPSULE | Freq: Every day | ORAL | 0 refills | Status: DC

## 2019-08-05 MED ORDER — PREDNISONE 10 MG (48) PO TBPK: ORAL_TABLET | Freq: Every day | ORAL | 0 refills | Status: DC
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
## 2019-09-09 ENCOUNTER — Other Ambulatory Visit: Payer: Self-pay | Admitting: Osteopathic Medicine

## 2019-09-09 ENCOUNTER — Telehealth: Payer: PRIVATE HEALTH INSURANCE | Admitting: Emergency Medicine

## 2019-09-09 DIAGNOSIS — J029 Acute pharyngitis, unspecified: Secondary | ICD-10-CM | POA: Diagnosis not present

## 2019-09-09 DIAGNOSIS — F909 Attention-deficit hyperactivity disorder, unspecified type: Secondary | ICD-10-CM

## 2019-09-09 NOTE — Progress Notes (Signed)
We are sorry that you are not feeling well.  Here is how we plan to help!  Your symptoms indicate a likely viral infection (Pharyngitis).   Pharyngitis is inflammation in the back of the throat which can cause a sore throat, scratchiness and sometimes difficulty swallowing.   Pharyngitis is typically caused by a respiratory virus and will just run its course.  Please keep in mind that your symptoms could last up to 10 days.  For throat pain, we recommend over the counter oral pain relief medications such as acetaminophen or aspirin, or anti-inflammatory medications such as ibuprofen or naproxen sodium.  Topical treatments such as oral throat lozenges or sprays may be used as needed.  Avoid close contact with loved ones, especially the very young and elderly.  Remember to wash your hands thoroughly throughout the day as this is the number one way to prevent the spread of infection and wipe down door knobs and counters with disinfectant.  After careful review of your answers, I would not recommend and antibiotic for your condition.  Antibiotics should not be used to treat conditions that we suspect are caused by viruses like the virus that causes the common cold or flu. However, some people can have Strep with atypical symptoms. You may need formal testing in clinic or office to confirm if your symptoms continue or worsen.  Providers prescribe antibiotics to treat infections caused by bacteria. Antibiotics are very powerful in treating bacterial infections when they are used properly.  To maintain their effectiveness, they should be used only when necessary.  Overuse of antibiotics has resulted in the development of super bugs that are resistant to treatment!    Home Care:  Only take medications as instructed by your medical team.  Do not drink alcohol while taking these medications.  A steam or ultrasonic humidifier can help congestion.  You can place a towel over your head and breathe in the steam from  hot water coming from a faucet.  Avoid close contacts especially the very young and the elderly.  Cover your mouth when you cough or sneeze.  Always remember to wash your hands.  Get Help Right Away If:  You develop worsening fever or throat pain.  You develop a severe head ache or visual changes.  Your symptoms persist after you have completed your treatment plan.  Make sure you  Understand these instructions.  Will watch your condition.  Will get help right away if you are not doing well or get worse.  Your e-visit answers were reviewed by a board certified advanced clinical practitioner to complete your personal care plan.  Depending on the condition, your plan could have included both over the counter or prescription medications.  If there is a problem please reply  once you have received a response from your provider.  Your safety is important to us.  If you have drug allergies check your prescription carefully.    You can use MyChart to ask questions about todays visit, request a non-urgent call back, or ask for a work or school excuse for 24 hours related to this e-Visit. If it has been greater than 24 hours you will need to follow up with your provider, or enter a new e-Visit to address those concerns.  You will get an e-mail in the next two days asking about your experience.  I hope that your e-visit has been valuable and will speed your recovery. Thank you for using e-visits.  Approximately 5 minutes was used   in reviewing the patient's chart, questionnaire, prescribing medications, and documentation.   

## 2019-09-10 NOTE — Telephone Encounter (Signed)
Refill request submitted via MyChart. Routing to assistant to address.  

## 2019-09-16 ENCOUNTER — Other Ambulatory Visit: Payer: Self-pay

## 2019-09-16 DIAGNOSIS — F909 Attention-deficit hyperactivity disorder, unspecified type: Secondary | ICD-10-CM

## 2019-09-16 MED ORDER — AMPHETAMINE-DEXTROAMPHET ER 20 MG PO CP24: 20.0000 mg | ORAL_CAPSULE | Freq: Every day | ORAL | 0 refills | Status: DC

## 2019-09-16 MED ORDER — AMPHETAMINE-DEXTROAMPHETAMINE 10 MG PO TABS: 10.0000 mg | ORAL_TABLET | Freq: Every day | ORAL | 0 refills | Status: DC

## 2019-09-16 NOTE — Telephone Encounter (Signed)
Refill request forwarded to provider for approval.

## 2019-09-16 NOTE — Telephone Encounter (Signed)
Medication refill request  Last refill- 08/05/2019 Last Ov- 05/05/2019

## 2019-10-15 ENCOUNTER — Other Ambulatory Visit: Payer: Self-pay | Admitting: Osteopathic Medicine

## 2019-10-15 DIAGNOSIS — F909 Attention-deficit hyperactivity disorder, unspecified type: Secondary | ICD-10-CM

## 2019-10-19 MED ORDER — AMPHETAMINE-DEXTROAMPHET ER 20 MG PO CP24: 20.0000 mg | ORAL_CAPSULE | Freq: Every day | ORAL | 0 refills | Status: DC

## 2019-10-19 MED ORDER — AMPHETAMINE-DEXTROAMPHETAMINE 10 MG PO TABS: 10.0000 mg | ORAL_TABLET | Freq: Every day | ORAL | 0 refills | Status: DC

## 2019-10-19 NOTE — Telephone Encounter (Signed)
Last Ov- 05/05/2019 Last refill-09/16/2019

## 2019-11-22 ENCOUNTER — Other Ambulatory Visit: Payer: Self-pay | Admitting: Osteopathic Medicine

## 2019-11-22 DIAGNOSIS — F909 Attention-deficit hyperactivity disorder, unspecified type: Secondary | ICD-10-CM

## 2019-11-29 ENCOUNTER — Ambulatory Visit (INDEPENDENT_AMBULATORY_CARE_PROVIDER_SITE_OTHER): Payer: PRIVATE HEALTH INSURANCE | Admitting: Osteopathic Medicine

## 2019-11-29 ENCOUNTER — Encounter: Payer: Self-pay | Admitting: Osteopathic Medicine

## 2019-11-29 DIAGNOSIS — F909 Attention-deficit hyperactivity disorder, unspecified type: Secondary | ICD-10-CM

## 2019-11-29 MED ORDER — AMPHETAMINE-DEXTROAMPHET ER 30 MG PO CP24: 30.0000 mg | ORAL_CAPSULE | Freq: Every day | ORAL | 0 refills | Status: DC

## 2019-11-29 MED ORDER — AMPHETAMINE-DEXTROAMPHETAMINE 15 MG PO TABS: 15.0000 mg | ORAL_TABLET | Freq: Every day | ORAL | 0 refills | Status: DC

## 2019-11-29 NOTE — Progress Notes (Signed)
Edwin Williams is a 34 y.o. male who presents to  [facility] at [facility]  today, 11/29/19, seeking care for the following:  . Adderall refills needed - requests increase dose ,feels like it's been wearing off sooner and not working as well while its's active  . Requesting COVID testing, he was (+) around 11/05/2019, recovered without issues.      ASSESSMENT & PLAN with other pertinent findings:  The encounter diagnosis was Adult ADHD.   Letter written - repeat testing not medically needed.    Recommended vaccination    Meds ordered this encounter  Medications  . amphetamine-dextroamphetamine (ADDERALL) 15 MG tablet    Sig: Take 1 tablet by mouth daily after lunch.    Dispense:  30 tablet    Refill:  0  . amphetamine-dextroamphetamine (ADDERALL XR) 30 MG 24 hr capsule    Sig: Take 1 capsule (30 mg total) by mouth daily.    Dispense:  30 capsule    Refill:  0       Follow-up instructions: Return in about 4 weeks (around 12/27/2019) for VIRTUAL VISIT RECHECK ON HIGHER DOSE ADHD RX .                                         BP 132/80 (BP Location: Right Arm, Patient Position: Sitting, Cuff Size: Large)   Pulse (!) 109   Temp 98.3 F (36.8 C) (Oral)   Wt 229 lb 0.6 oz (103.9 kg)   BMI 31.94 kg/m   Current Meds  Medication Sig  . amphetamine-dextroamphetamine (ADDERALL XR) 30 MG 24 hr capsule Take 1 capsule (30 mg total) by mouth daily.  Marland Kitchen amphetamine-dextroamphetamine (ADDERALL) 15 MG tablet Take 1 tablet by mouth daily after lunch.  . ibuprofen (ADVIL) 800 MG tablet Take 1 tablet (800 mg total) by mouth every 8 (eight) hours as needed.  . Nicotine Polacrilex (TGT NICOTINE MT) Use as directed 4-8 mg in the mouth or throat every 2 (two) hours as needed.  . [DISCONTINUED] amphetamine-dextroamphetamine (ADDERALL XR) 20 MG 24 hr capsule Take 1 capsule (20 mg total) by mouth daily.  .  [DISCONTINUED] amphetamine-dextroamphetamine (ADDERALL) 10 MG tablet Take 1 tablet (10 mg total) by mouth daily after lunch.    No results found for this or any previous visit (from the past 72 hour(s)).  No results found.     All questions at time of visit were answered - patient instructed to contact office with any additional concerns or updates.  ER/RTC precautions were reviewed with the patient as applicable.   Please note: voice recognition software was used to produce this document, and typos may escape review. Please contact Dr. Lyn Hollingshead for any needed clarifications.

## 2019-12-01 ENCOUNTER — Other Ambulatory Visit: Payer: Self-pay | Admitting: Osteopathic Medicine

## 2019-12-27 ENCOUNTER — Telehealth (INDEPENDENT_AMBULATORY_CARE_PROVIDER_SITE_OTHER): Payer: PRIVATE HEALTH INSURANCE | Admitting: Osteopathic Medicine

## 2019-12-27 ENCOUNTER — Encounter: Payer: Self-pay | Admitting: Osteopathic Medicine

## 2019-12-27 DIAGNOSIS — F909 Attention-deficit hyperactivity disorder, unspecified type: Secondary | ICD-10-CM | POA: Diagnosis not present

## 2019-12-27 MED ORDER — AMPHETAMINE-DEXTROAMPHET ER 30 MG PO CP24: 30.0000 mg | ORAL_CAPSULE | Freq: Every day | ORAL | 0 refills | Status: DC

## 2019-12-27 MED ORDER — AMPHETAMINE-DEXTROAMPHETAMINE 15 MG PO TABS: 15.0000 mg | ORAL_TABLET | Freq: Every day | ORAL | 0 refills | Status: DC
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
## 2020-02-28 ENCOUNTER — Other Ambulatory Visit: Payer: Self-pay | Admitting: Osteopathic Medicine

## 2020-02-28 MED ORDER — IBUPROFEN 800 MG PO TABS: 800.0000 mg | ORAL_TABLET | Freq: Three times a day (TID) | ORAL | 0 refills | Status: DC | PRN

## 2020-03-28 ENCOUNTER — Ambulatory Visit (INDEPENDENT_AMBULATORY_CARE_PROVIDER_SITE_OTHER): Payer: PRIVATE HEALTH INSURANCE | Admitting: Osteopathic Medicine

## 2020-03-28 ENCOUNTER — Encounter: Payer: Self-pay | Admitting: Osteopathic Medicine

## 2020-03-28 VITALS — BP 130/73 | HR 98 | Temp 97.8°F | Wt 214.1 lb

## 2020-03-28 DIAGNOSIS — M25511 Pain in right shoulder: Secondary | ICD-10-CM

## 2020-03-28 DIAGNOSIS — F909 Attention-deficit hyperactivity disorder, unspecified type: Secondary | ICD-10-CM

## 2020-03-28 DIAGNOSIS — G8929 Other chronic pain: Secondary | ICD-10-CM

## 2020-03-28 MED ORDER — MELOXICAM 15 MG PO TABS: 15.0000 mg | ORAL_TABLET | Freq: Every day | ORAL | 1 refills | Status: DC

## 2020-03-28 MED ORDER — AMPHETAMINE-DEXTROAMPHETAMINE 15 MG PO TABS: 15.0000 mg | ORAL_TABLET | Freq: Every day | ORAL | 0 refills | Status: DC

## 2020-03-28 MED ORDER — AMPHETAMINE-DEXTROAMPHET ER 30 MG PO CP24: 30.0000 mg | ORAL_CAPSULE | Freq: Every day | ORAL | 0 refills | Status: DC
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
## 2020-04-07 ENCOUNTER — Ambulatory Visit: Payer: PRIVATE HEALTH INSURANCE | Admitting: Sports Medicine

## 2020-05-12 ENCOUNTER — Telehealth: Payer: Self-pay | Admitting: General Practice

## 2020-05-12 NOTE — Telephone Encounter (Signed)
Transition Care Management Follow-up Telephone Call  Date of discharge and from where: [facility] 05/11/20  How have you been since you were released from the hospital? Patient stated he is still in pain.  Any questions or concerns? Yes  Items Reviewed:  Did the pt receive and understand the discharge instructions provided? Yes   Medications obtained and verified? Yes   Other? No   Any new allergies since your discharge? No   Dietary orders reviewed? Yes  Do you have support at home? Yes   Home Care and Equipment/Supplies: Were home health services ordered? no  Functional Questionnaire: (I = Independent and D = Dependent) ADLs: I  Bathing/Dressing- I  Meal Prep- I  Eating- I  Maintaining continence- I  Transferring/Ambulation- I  Managing Meds- I  Follow up appointments reviewed:   PCP Hospital f/u appt confirmed? Patient stated he was asked to only follow up with his dentist at this time.   Specialist Hospital f/u appt confirmed? He said that he will make the appointment with the dentist.  Are transportation arrangements needed? No   If their condition worsens, is the pt aware to call PCP or go to the Emergency Dept.? Yes  Was the patient provided with contact information for the PCP's office or ED? Yes  Was to pt encouraged to call back with questions or concerns? Yes

## 2020-06-16 ENCOUNTER — Other Ambulatory Visit: Payer: Self-pay

## 2020-06-16 ENCOUNTER — Telehealth: Payer: Self-pay | Admitting: General Practice

## 2020-06-16 MED ORDER — IBUPROFEN 800 MG PO TABS: 800.0000 mg | ORAL_TABLET | Freq: Three times a day (TID) | ORAL | 0 refills | Status: DC | PRN

## 2020-06-16 NOTE — Telephone Encounter (Signed)
Transition Care Management Follow-up Telephone Call  Date of discharge and from where: [facility] 06/15/20  How have you been since you were released from the hospital? Still in pain.   Any questions or concerns? No  Items Reviewed:  Did the pt receive and understand the discharge instructions provided? Yes   Medications obtained and verified? Yes   Other? No   Any new allergies since your discharge? No   Dietary orders reviewed? Yes  Do you have support at home? No   Home Care and Equipment/Supplies: Were home health services ordered? no   Functional Questionnaire: (I = Independent and D = Dependent) ADLs: I  Bathing/Dressing- I  Meal Prep- I  Eating- I  Maintaining continence- I  Transferring/Ambulation- I  Managing Meds- I   Follow up appointments reviewed:   PCP Hospital f/u appt confirmed? No  Patient will to schedule an appointment.   Specialist Hospital f/u appt confirmed? No    Are transportation arrangements needed? No   If their condition worsens, is the pt aware to call PCP or go to the Emergency Dept.? Yes  Was the patient provided with contact information for the PCP's office or ED? Yes  Was to pt encouraged to call back with questions or concerns? Yes

## 2020-06-27 ENCOUNTER — Other Ambulatory Visit: Payer: Self-pay | Admitting: Osteopathic Medicine

## 2020-06-27 DIAGNOSIS — F909 Attention-deficit hyperactivity disorder, unspecified type: Secondary | ICD-10-CM

## 2020-06-27 MED ORDER — AMPHETAMINE-DEXTROAMPHETAMINE 15 MG PO TABS: 15.0000 mg | ORAL_TABLET | Freq: Every day | ORAL | 0 refills | Status: DC

## 2020-06-27 MED ORDER — AMPHETAMINE-DEXTROAMPHET ER 30 MG PO CP24: 30.0000 mg | ORAL_CAPSULE | Freq: Every day | ORAL | 0 refills | Status: DC

## 2020-08-27 IMAGING — DX DG FINGER LITTLE 2+V*R*
3 series · 3 of 3 positions shown · non-contrast
Comparison: 02/18/2016

CLINICAL DATA: Pain distal aspect fifth digit, concern for foreign
body

EXAM:
RIGHT LITTLE FINGER 2+V

[finger ap]
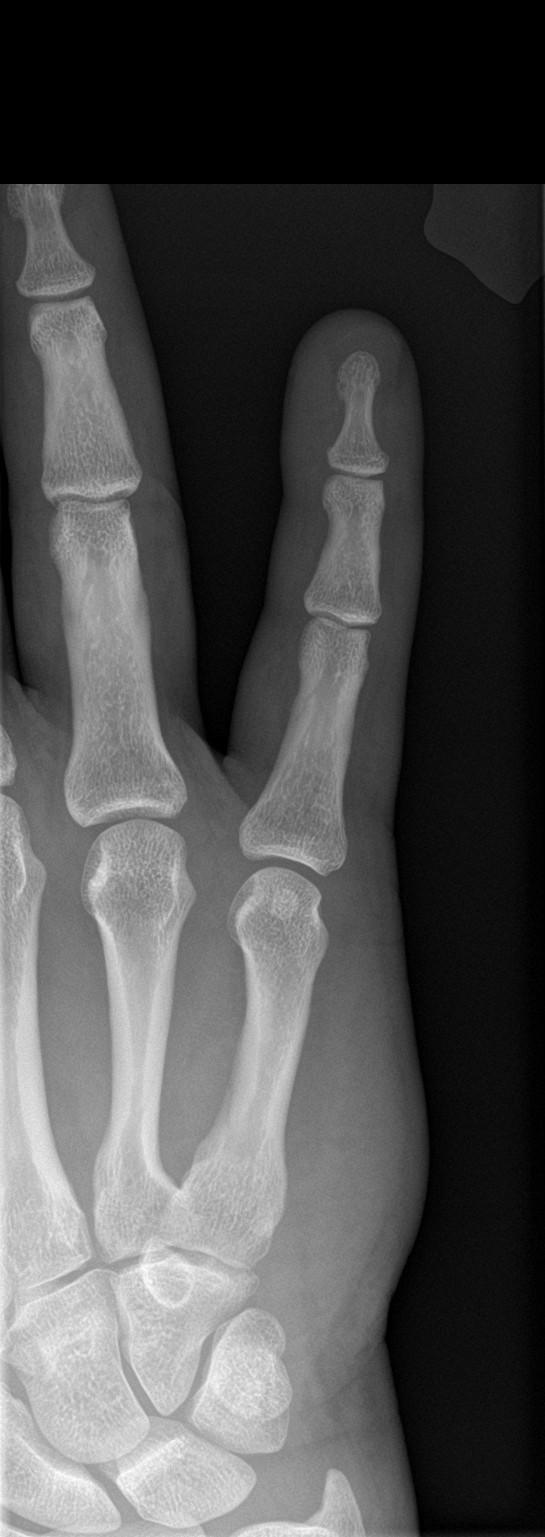

[finger obl]
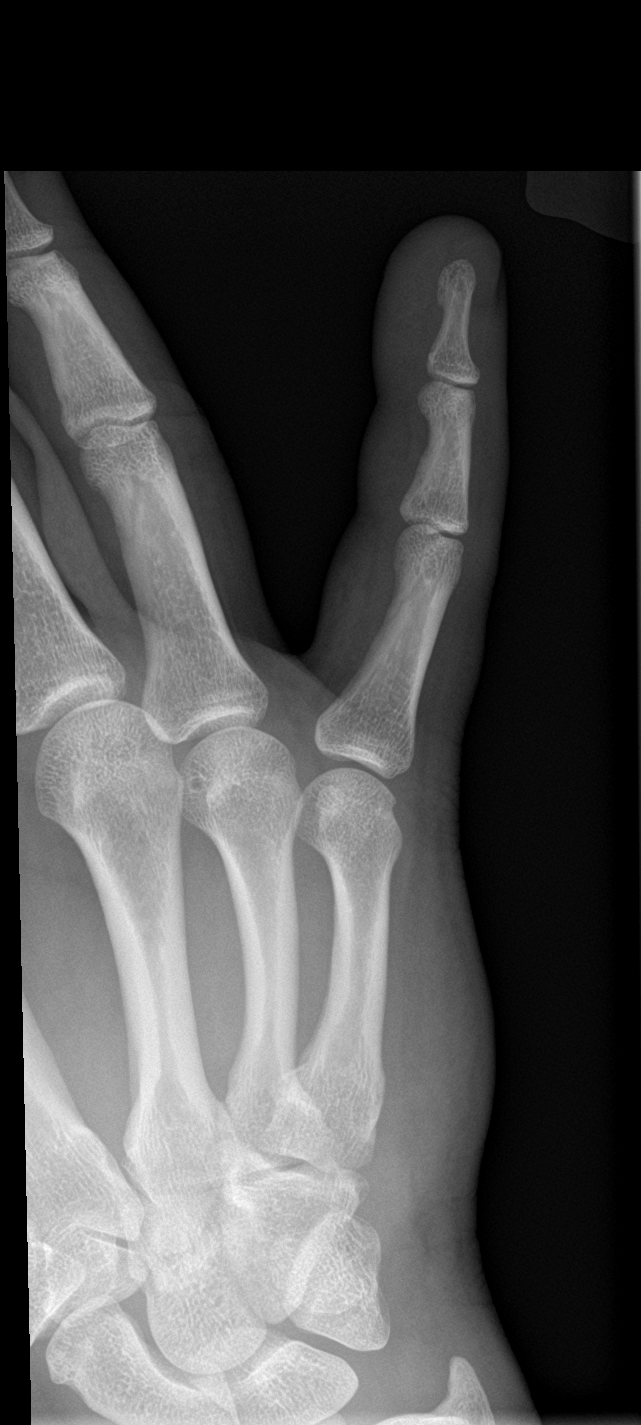

[finger lat]
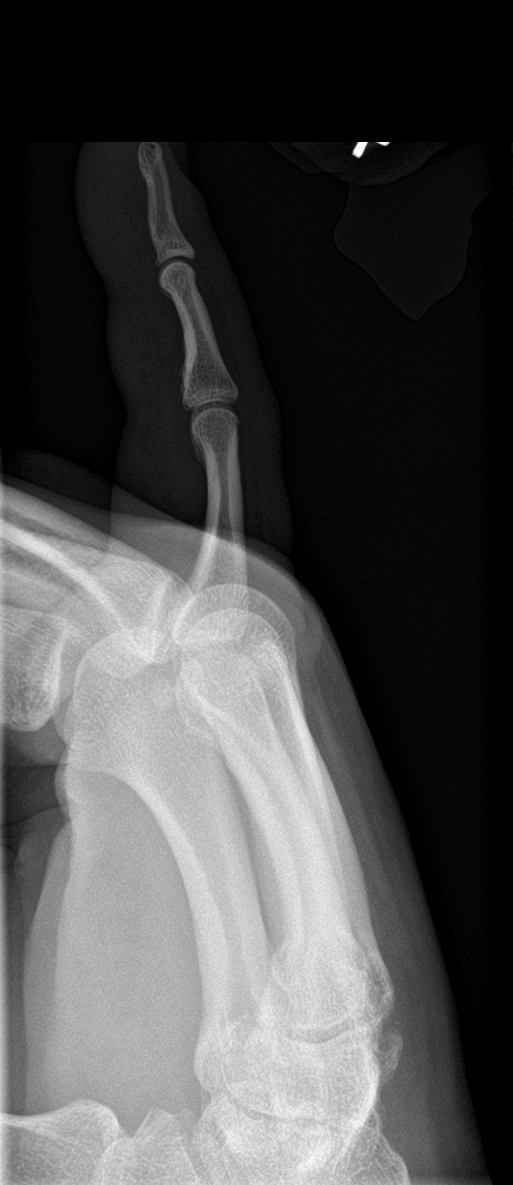

[3 of 3 positions shown; findings below may reference images not displayed]

FINDINGS: Frontal, oblique, and lateral views of the right fifth digit are
obtained. No acute displaced fractures. Site spaces are well
preserved. There is diffuse soft tissue edema. No radiopaque foreign
bodies.
IMPRESSION: 1. No fracture or radiopaque foreign body.

## 2020-09-25 ENCOUNTER — Other Ambulatory Visit: Payer: Self-pay | Admitting: Osteopathic Medicine

## 2020-09-25 DIAGNOSIS — F909 Attention-deficit hyperactivity disorder, unspecified type: Secondary | ICD-10-CM

## 2020-10-02 ENCOUNTER — Encounter: Payer: Self-pay | Admitting: Osteopathic Medicine

## 2020-11-13 ENCOUNTER — Encounter: Payer: Self-pay | Admitting: Family Medicine

## 2020-11-13 ENCOUNTER — Ambulatory Visit (INDEPENDENT_AMBULATORY_CARE_PROVIDER_SITE_OTHER): Payer: PRIVATE HEALTH INSURANCE | Admitting: Family Medicine

## 2020-11-13 DIAGNOSIS — F909 Attention-deficit hyperactivity disorder, unspecified type: Secondary | ICD-10-CM | POA: Diagnosis not present

## 2020-11-13 MED ORDER — AMPHETAMINE-DEXTROAMPHET ER 30 MG PO CP24: 30.0000 mg | ORAL_CAPSULE | Freq: Every day | ORAL | 0 refills | Status: DC

## 2020-11-13 MED ORDER — AMPHETAMINE-DEXTROAMPHETAMINE 15 MG PO TABS: 15.0000 mg | ORAL_TABLET | Freq: Every day | ORAL | 0 refills | Status: DC
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
## 2020-11-13 NOTE — Assessment & Plan Note (Signed)
He continues to do well with Adderall XR daily with additional Adderall IR in the afternoon as needed.  No side effects noted with this.  He will continue current strength.  PDMP reviewed.  Prescriptions renewed.  Return in about 6 months (around 05/14/2021) for ADD.

## 2020-12-13 ENCOUNTER — Telehealth: Payer: Self-pay | Admitting: General Practice

## 2020-12-13 NOTE — Telephone Encounter (Signed)
Transition Care Management Unsuccessful Follow-up Telephone Call  Date of discharge and from where:  12/11/20 from [facility]  Attempts:  1st Attempt  Reason for unsuccessful TCM follow-up call:  Unable to reach patient

## 2020-12-15 NOTE — Telephone Encounter (Signed)
Transition Care Management Unsuccessful Follow-up Telephone Call  Date of discharge and from where:  12/11/20 from [facility]  Attempts:  2nd Attempt  Reason for unsuccessful TCM follow-up call:  Unable to reach patient

## 2020-12-18 NOTE — Telephone Encounter (Signed)
Transition Care Management Unsuccessful Follow-up Telephone Call  Date of discharge and from where:  12/11/20 from [facility]  Attempts:  3rd Attempt  Reason for unsuccessful TCM follow-up call:  Unable to reach patient

## 2021-02-15 ENCOUNTER — Other Ambulatory Visit: Payer: Self-pay | Admitting: Family Medicine

## 2021-02-15 DIAGNOSIS — F909 Attention-deficit hyperactivity disorder, unspecified type: Secondary | ICD-10-CM

## 2021-02-15 MED ORDER — AMPHETAMINE-DEXTROAMPHETAMINE 15 MG PO TABS: 15.0000 mg | ORAL_TABLET | Freq: Every day | ORAL | 0 refills | Status: DC

## 2021-02-15 MED ORDER — AMPHETAMINE-DEXTROAMPHET ER 30 MG PO CP24: 30.0000 mg | ORAL_CAPSULE | Freq: Every day | ORAL | 0 refills | Status: DC

## 2021-03-13 ENCOUNTER — Other Ambulatory Visit: Payer: Self-pay

## 2021-03-13 DIAGNOSIS — F909 Attention-deficit hyperactivity disorder, unspecified type: Secondary | ICD-10-CM

## 2021-03-13 MED ORDER — AMPHETAMINE-DEXTROAMPHET ER 30 MG PO CP24: 30.0000 mg | ORAL_CAPSULE | Freq: Every day | ORAL | 0 refills | Status: DC

## 2021-03-13 MED ORDER — AMPHETAMINE-DEXTROAMPHETAMINE 15 MG PO TABS: 15.0000 mg | ORAL_TABLET | Freq: Every day | ORAL | 0 refills | Status: DC

## 2021-03-13 NOTE — Telephone Encounter (Signed)
Patient called requesting his two adderall rx's be sent to [facility] to be filled because of the shortage at his [employer].

## 2021-03-15 ENCOUNTER — Other Ambulatory Visit: Payer: Self-pay

## 2021-03-15 DIAGNOSIS — F909 Attention-deficit hyperactivity disorder, unspecified type: Secondary | ICD-10-CM

## 2021-03-15 MED ORDER — AMPHETAMINE-DEXTROAMPHETAMINE 15 MG PO TABS: 15.0000 mg | ORAL_TABLET | Freq: Every day | ORAL | 0 refills | Status: DC

## 2021-03-15 MED ORDER — AMPHETAMINE-DEXTROAMPHET ER 30 MG PO CP24: 30.0000 mg | ORAL_CAPSULE | Freq: Every day | ORAL | 0 refills | Status: DC

## 2021-03-15 NOTE — Progress Notes (Signed)
Updated rx sent

## 2021-05-14 ENCOUNTER — Ambulatory Visit: Payer: PRIVATE HEALTH INSURANCE | Admitting: Family Medicine

## 2021-05-16 ENCOUNTER — Ambulatory Visit: Payer: PRIVATE HEALTH INSURANCE | Admitting: Family Medicine

## 2021-06-13 ENCOUNTER — Ambulatory Visit (INDEPENDENT_AMBULATORY_CARE_PROVIDER_SITE_OTHER): Payer: BC Managed Care – PPO | Admitting: Family Medicine

## 2021-06-13 ENCOUNTER — Encounter: Payer: Self-pay | Admitting: Family Medicine

## 2021-06-13 DIAGNOSIS — F909 Attention-deficit hyperactivity disorder, unspecified type: Secondary | ICD-10-CM | POA: Diagnosis not present

## 2021-06-13 MED ORDER — MELOXICAM 15 MG PO TABS: 15.0000 mg | ORAL_TABLET | Freq: Every day | ORAL | 1 refills | Status: DC

## 2021-06-13 MED ORDER — AMPHETAMINE-DEXTROAMPHET ER 30 MG PO CP24: 30.0000 mg | ORAL_CAPSULE | Freq: Every day | ORAL | 0 refills | Status: DC

## 2021-06-13 MED ORDER — AMPHETAMINE-DEXTROAMPHETAMINE 15 MG PO TABS: 15.0000 mg | ORAL_TABLET | Freq: Every day | ORAL | 0 refills | Status: DC
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
## 2021-06-13 NOTE — Assessment & Plan Note (Signed)
Doing well with adderall at current strength.  Recommend continuation of current medications.  Refills sent in.   ?

## 2021-06-20 ENCOUNTER — Ambulatory Visit: Payer: PRIVATE HEALTH INSURANCE | Admitting: Family Medicine

## 2021-08-10 ENCOUNTER — Other Ambulatory Visit: Payer: Self-pay | Admitting: Family Medicine

## 2021-08-10 DIAGNOSIS — F909 Attention-deficit hyperactivity disorder, unspecified type: Secondary | ICD-10-CM

## 2021-08-13 DIAGNOSIS — M542 Cervicalgia: Secondary | ICD-10-CM | POA: Diagnosis not present

## 2021-08-13 DIAGNOSIS — M9903 Segmental and somatic dysfunction of lumbar region: Secondary | ICD-10-CM | POA: Diagnosis not present

## 2021-08-13 DIAGNOSIS — M9902 Segmental and somatic dysfunction of thoracic region: Secondary | ICD-10-CM | POA: Diagnosis not present

## 2021-08-13 DIAGNOSIS — M47816 Spondylosis without myelopathy or radiculopathy, lumbar region: Secondary | ICD-10-CM | POA: Diagnosis not present

## 2021-08-13 MED ORDER — AMPHETAMINE-DEXTROAMPHETAMINE 15 MG PO TABS: 15.0000 mg | ORAL_TABLET | Freq: Every day | ORAL | 0 refills | Status: DC

## 2021-08-20 DIAGNOSIS — M47816 Spondylosis without myelopathy or radiculopathy, lumbar region: Secondary | ICD-10-CM | POA: Diagnosis not present

## 2021-08-20 DIAGNOSIS — M9903 Segmental and somatic dysfunction of lumbar region: Secondary | ICD-10-CM | POA: Diagnosis not present

## 2021-08-20 DIAGNOSIS — M542 Cervicalgia: Secondary | ICD-10-CM | POA: Diagnosis not present

## 2021-08-20 DIAGNOSIS — M9902 Segmental and somatic dysfunction of thoracic region: Secondary | ICD-10-CM | POA: Diagnosis not present

## 2021-09-26 ENCOUNTER — Other Ambulatory Visit: Payer: Self-pay | Admitting: Family Medicine

## 2021-09-26 DIAGNOSIS — F909 Attention-deficit hyperactivity disorder, unspecified type: Secondary | ICD-10-CM

## 2021-09-28 ENCOUNTER — Other Ambulatory Visit: Payer: Self-pay | Admitting: Family Medicine

## 2021-09-28 DIAGNOSIS — F909 Attention-deficit hyperactivity disorder, unspecified type: Secondary | ICD-10-CM

## 2021-09-28 MED ORDER — AMPHETAMINE-DEXTROAMPHET ER 30 MG PO CP24: 30.0000 mg | ORAL_CAPSULE | Freq: Every day | ORAL | 0 refills | Status: DC

## 2021-09-28 MED ORDER — AMPHETAMINE-DEXTROAMPHETAMINE 15 MG PO TABS: 15.0000 mg | ORAL_TABLET | Freq: Every day | ORAL | 0 refills | Status: DC

## 2021-10-01 MED ORDER — AMPHETAMINE-DEXTROAMPHETAMINE 15 MG PO TABS: 15.0000 mg | ORAL_TABLET | Freq: Every day | ORAL | 0 refills | Status: DC

## 2021-10-30 ENCOUNTER — Other Ambulatory Visit: Payer: Self-pay | Admitting: Family Medicine

## 2021-10-30 DIAGNOSIS — F909 Attention-deficit hyperactivity disorder, unspecified type: Secondary | ICD-10-CM

## 2021-11-02 MED ORDER — AMPHETAMINE-DEXTROAMPHET ER 30 MG PO CP24: 30.0000 mg | ORAL_CAPSULE | Freq: Every day | ORAL | 0 refills | Status: DC

## 2021-11-19 ENCOUNTER — Other Ambulatory Visit: Payer: Self-pay | Admitting: Family Medicine

## 2021-12-17 ENCOUNTER — Ambulatory Visit: Payer: Self-pay | Admitting: Family Medicine

## 2021-12-31 ENCOUNTER — Encounter: Payer: Self-pay | Admitting: Family Medicine

## 2021-12-31 ENCOUNTER — Ambulatory Visit: Payer: BC Managed Care – PPO | Admitting: Family Medicine

## 2021-12-31 DIAGNOSIS — F909 Attention-deficit hyperactivity disorder, unspecified type: Secondary | ICD-10-CM

## 2021-12-31 MED ORDER — AMPHETAMINE-DEXTROAMPHET ER 30 MG PO CP24: 30.0000 mg | ORAL_CAPSULE | Freq: Every day | ORAL | 0 refills | Status: DC

## 2021-12-31 MED ORDER — AMPHETAMINE-DEXTROAMPHETAMINE 15 MG PO TABS: 15.0000 mg | ORAL_TABLET | Freq: Every day | ORAL | 0 refills | Status: DC

## 2021-12-31 MED ORDER — AMPHETAMINE-DEXTROAMPHETAMINE 15 MG PO TABS
15.0000 mg | ORAL_TABLET | Freq: Every day | ORAL | 0 refills | Status: DC
Start: 2021-12-31 — End: 2022-07-08
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
## 2021-12-31 NOTE — Assessment & Plan Note (Signed)
Doing well with current medications for management of his ADHD symptoms.  He will continue medications at current strength.  F/u in 6 months.

## 2022-06-13 ENCOUNTER — Other Ambulatory Visit: Payer: Self-pay | Admitting: Family Medicine

## 2022-06-14 MED ORDER — AMPHETAMINE-DEXTROAMPHETAMINE 15 MG PO TABS: 15.0000 mg | ORAL_TABLET | Freq: Every day | ORAL | 0 refills | Status: DC

## 2022-07-08 ENCOUNTER — Ambulatory Visit (INDEPENDENT_AMBULATORY_CARE_PROVIDER_SITE_OTHER): Payer: BC Managed Care – PPO | Admitting: Family Medicine

## 2022-07-08 ENCOUNTER — Encounter: Payer: Self-pay | Admitting: Family Medicine

## 2022-07-08 VITALS — BP 125/77 | HR 78 | Ht 71.0 in | Wt 244.0 lb

## 2022-07-08 DIAGNOSIS — F909 Attention-deficit hyperactivity disorder, unspecified type: Secondary | ICD-10-CM | POA: Diagnosis not present

## 2022-07-08 DIAGNOSIS — M79641 Pain in right hand: Secondary | ICD-10-CM | POA: Diagnosis not present

## 2022-07-08 DIAGNOSIS — R202 Paresthesia of skin: Secondary | ICD-10-CM | POA: Diagnosis not present

## 2022-07-08 DIAGNOSIS — M79642 Pain in left hand: Secondary | ICD-10-CM

## 2022-07-08 MED ORDER — AMPHETAMINE-DEXTROAMPHETAMINE 15 MG PO TABS: 15.0000 mg | ORAL_TABLET | Freq: Every day | ORAL | 0 refills | Status: DC

## 2022-07-08 MED ORDER — NAPROXEN 500 MG PO TABS
500.0000 mg | ORAL_TABLET | Freq: Two times a day (BID) | ORAL | 1 refills | Status: DC
Start: 2022-07-08 — End: 2022-12-17

## 2022-07-08 MED ORDER — GABAPENTIN 300 MG PO CAPS: ORAL_CAPSULE | ORAL | 3 refills | Status: DC

## 2022-07-08 MED ORDER — AMPHETAMINE-DEXTROAMPHET ER 30 MG PO CP24
30.0000 mg | ORAL_CAPSULE | Freq: Every day | ORAL | 0 refills | Status: DC
Start: 2022-07-08 — End: 2022-10-04

## 2022-07-08 NOTE — Assessment & Plan Note (Signed)
He is having some paresthesias and pain related to this.  Adding gabapentin and changing anti-inflammatory to Naproxen.

## 2022-07-08 NOTE — Assessment & Plan Note (Signed)
He has had chronic paresthesias and pain in b/l hands.  Meloxicam has not really helped.
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
## 2022-07-08 NOTE — Assessment & Plan Note (Signed)
Doing well with combination of adderall xr and adderall in the afternoon.  Will plan to continue this at current strength.

## 2022-07-08 NOTE — Patient Instructions (Signed)
Try Naproxen 500mg  to replace meloxicam. Try gabapentin.  Use at bedtime for the first week then you can increase to every 8 hours as needed.  See me again in 6 months.

## 2022-07-30 ENCOUNTER — Encounter: Payer: Self-pay | Admitting: Family Medicine

## 2022-10-04 ENCOUNTER — Other Ambulatory Visit: Payer: Self-pay | Admitting: Family Medicine

## 2022-10-04 DIAGNOSIS — F909 Attention-deficit hyperactivity disorder, unspecified type: Secondary | ICD-10-CM

## 2022-10-04 NOTE — Telephone Encounter (Signed)
Message forwarded to Christen Butter, NP covering Dr. Ashley Royalty.  Requesting rx rf of both Adderall 15mg  - last written 06/03/02024 Adderall 30mg  XR - last written 06/03/02024 Last OV 07/08/2022 Upcoming appt 01/13/2023

## 2022-10-08 MED ORDER — AMPHETAMINE-DEXTROAMPHETAMINE 15 MG PO TABS: 15.0000 mg | ORAL_TABLET | Freq: Every day | ORAL | 0 refills | Status: DC

## 2022-10-08 MED ORDER — AMPHETAMINE-DEXTROAMPHET ER 30 MG PO CP24
30.0000 mg | ORAL_CAPSULE | Freq: Every day | ORAL | 0 refills | Status: DC
Start: 2022-10-08 — End: 2022-12-13

## 2022-12-13 ENCOUNTER — Other Ambulatory Visit: Payer: Self-pay | Admitting: Family Medicine

## 2022-12-13 DIAGNOSIS — F909 Attention-deficit hyperactivity disorder, unspecified type: Secondary | ICD-10-CM

## 2022-12-17 ENCOUNTER — Other Ambulatory Visit: Payer: Self-pay | Admitting: Family Medicine

## 2022-12-20 MED ORDER — AMPHETAMINE-DEXTROAMPHETAMINE 15 MG PO TABS: 15.0000 mg | ORAL_TABLET | Freq: Every day | ORAL | 0 refills | Status: DC

## 2022-12-20 MED ORDER — AMPHETAMINE-DEXTROAMPHET ER 30 MG PO CP24
30.0000 mg | ORAL_CAPSULE | Freq: Every day | ORAL | 0 refills | Status: DC
Start: 2022-12-20 — End: 2023-01-13

## 2023-01-13 ENCOUNTER — Encounter: Payer: Self-pay | Admitting: Family Medicine

## 2023-01-13 ENCOUNTER — Ambulatory Visit (INDEPENDENT_AMBULATORY_CARE_PROVIDER_SITE_OTHER): Payer: Medicaid Other | Admitting: Family Medicine

## 2023-01-13 VITALS — BP 110/75 | HR 84 | Ht 71.0 in | Wt 261.0 lb

## 2023-01-13 DIAGNOSIS — R202 Paresthesia of skin: Secondary | ICD-10-CM

## 2023-01-13 DIAGNOSIS — F909 Attention-deficit hyperactivity disorder, unspecified type: Secondary | ICD-10-CM

## 2023-01-13 MED ORDER — AMPHETAMINE-DEXTROAMPHET ER 30 MG PO CP24: 30.0000 mg | ORAL_CAPSULE | Freq: Every day | ORAL | 0 refills | Status: DC

## 2023-01-13 MED ORDER — AMPHETAMINE-DEXTROAMPHETAMINE 15 MG PO TABS: 15.0000 mg | ORAL_TABLET | Freq: Every day | ORAL | 0 refills | Status: DC
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
## 2023-01-13 NOTE — Assessment & Plan Note (Signed)
Gabapentin is helpful.  Grip strength and overall hand strength remains normal.  He has not had sedation or other side effects related to use of gabapentin.  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

## 2023-01-13 NOTE — Patient Instructions (Signed)
Check to see if insurance covers: [employer]

## 2023-01-13 NOTE — Assessment & Plan Note (Signed)
Doing well with combination of adderall xr and adderall in the afternoon.  Symptoms are well controlled at this time. Will plan to continue this at current strength.

## 2023-02-11 ENCOUNTER — Other Ambulatory Visit: Payer: Self-pay | Admitting: Family Medicine

## 2023-03-21 ENCOUNTER — Other Ambulatory Visit: Payer: Self-pay | Admitting: Family Medicine

## 2023-03-25 ENCOUNTER — Other Ambulatory Visit: Payer: Self-pay | Admitting: Family Medicine

## 2023-03-25 DIAGNOSIS — F909 Attention-deficit hyperactivity disorder, unspecified type: Secondary | ICD-10-CM

## 2023-03-26 MED ORDER — AMPHETAMINE-DEXTROAMPHET ER 30 MG PO CP24: 30.0000 mg | ORAL_CAPSULE | Freq: Every day | ORAL | 0 refills | Status: DC

## 2023-03-26 MED ORDER — AMPHETAMINE-DEXTROAMPHETAMINE 15 MG PO TABS: 15.0000 mg | ORAL_TABLET | Freq: Every day | ORAL | 0 refills | Status: DC

## 2023-03-26 MED ORDER — GABAPENTIN 300 MG PO CAPS: ORAL_CAPSULE | ORAL | 0 refills | Status: DC

## 2023-03-26 MED ORDER — NAPROXEN 500 MG PO TABS: 500.0000 mg | ORAL_TABLET | Freq: Two times a day (BID) | ORAL | 0 refills | Status: DC

## 2023-07-01 ENCOUNTER — Ambulatory Visit (INDEPENDENT_AMBULATORY_CARE_PROVIDER_SITE_OTHER): Admitting: Family Medicine

## 2023-07-01 ENCOUNTER — Encounter: Payer: Self-pay | Admitting: Family Medicine

## 2023-07-01 VITALS — BP 129/80 | HR 89 | Ht 71.0 in | Wt 263.0 lb

## 2023-07-01 DIAGNOSIS — M542 Cervicalgia: Secondary | ICD-10-CM | POA: Insufficient documentation

## 2023-07-01 DIAGNOSIS — F909 Attention-deficit hyperactivity disorder, unspecified type: Secondary | ICD-10-CM

## 2023-07-01 MED ORDER — METHYLPREDNISOLONE 4 MG PO TBPK: ORAL_TABLET | ORAL | 0 refills | Status: DC

## 2023-07-01 MED ORDER — NAPROXEN 500 MG PO TABS: 500.0000 mg | ORAL_TABLET | Freq: Two times a day (BID) | ORAL | 0 refills | Status: DC

## 2023-07-01 MED ORDER — AMPHETAMINE-DEXTROAMPHET ER 30 MG PO CP24: 30.0000 mg | ORAL_CAPSULE | Freq: Every day | ORAL | 0 refills | Status: DC

## 2023-07-01 MED ORDER — GABAPENTIN 300 MG PO CAPS
ORAL_CAPSULE | ORAL | 0 refills | Status: DC
Start: 2023-07-01 — End: 2023-07-28

## 2023-07-01 MED ORDER — AMPHETAMINE-DEXTROAMPHETAMINE 15 MG PO TABS: 15.0000 mg | ORAL_TABLET | Freq: Every day | ORAL | 0 refills | Status: DC

## 2023-07-01 MED ORDER — CYCLOBENZAPRINE HCL 10 MG PO TABS: 10.0000 mg | ORAL_TABLET | Freq: Three times a day (TID) | ORAL | 0 refills | Status: DC | PRN

## 2023-07-01 NOTE — Assessment & Plan Note (Signed)
 Adding medrol dosepak and flexeril.  He will let me know if not improving.
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
## 2023-07-01 NOTE — Assessment & Plan Note (Signed)
 Doing well with combination of adderall xr and adderall in the afternoon.  Symptoms are well controlled at this time. Will plan to continue this at current strength.

## 2023-07-14 ENCOUNTER — Ambulatory Visit: Payer: Medicaid Other | Admitting: Family Medicine

## 2023-07-28 ENCOUNTER — Other Ambulatory Visit: Payer: Self-pay | Admitting: Family Medicine

## 2023-07-28 ENCOUNTER — Ambulatory Visit: Payer: Medicaid Other | Admitting: Family Medicine

## 2023-08-12 ENCOUNTER — Other Ambulatory Visit: Payer: Self-pay | Admitting: Family Medicine

## 2023-08-14 ENCOUNTER — Telehealth: Admitting: Physician Assistant

## 2023-08-14 DIAGNOSIS — L255 Unspecified contact dermatitis due to plants, except food: Secondary | ICD-10-CM | POA: Diagnosis not present

## 2023-08-15 MED ORDER — TRIAMCINOLONE ACETONIDE 0.1 % EX CREA: 1.0000 | TOPICAL_CREAM | Freq: Two times a day (BID) | CUTANEOUS | 0 refills | Status: AC

## 2023-08-15 MED ORDER — PREDNISONE 10 MG (21) PO TBPK: ORAL_TABLET | ORAL | 0 refills | Status: DC

## 2023-08-15 NOTE — Progress Notes (Signed)
 E-Visit for [employer]  We are sorry that you are not feeing well.  Here is how we plan to help!  Based on what you have shared with me it looks like you have had an allergic reaction to the oily resin from a group of plants.  This resin is very sticky, so it easily attaches to your skin, clothing, tools equipment, and pet's fur.    This blistering rash is often called poison ivy rash although it can come from contact with the leaves, stems and roots of poison ivy, poison oak and poison sumac.  The oily resin contains urushiol (u-ROO-she-ol) that produces a skin rash on exposed skin.  The severity of the rash depends on the amount of urushiol that gets on your skin.  A section of skin with more urushiol on it may develop a rash sooner.  The rash usually develops 12-48 hours after exposure and can last two to three weeks.  Your skin must come in direct contact with the plant's oil to be affected.  Blister fluid doesn't spread the rash.  However, if you come into contact with a piece of clothing or pet fur that has urushiol on it, the rash may spread out.  You can also transfer the oil to other parts of your body with your fingers.  Often the rash looks like a straight line because of the way the plant brushes against your skin.  Since your rash is widespread or has resulted in a large number of blisters, I have prescribed an oral corticosteroid.  Please follow these recommendations:  I have sent a prednisone  dose pack to your chosen pharmacy. Be sure to follow the instructions carefully and complete the entire prescription. You may use Benadryl or Caladryl topical lotions to sooth the itch and remember cool, not hot, showers and baths can help relieve the itching!  Place cool, wet compresses on the affected area for 15-30 minutes several times a day.  You may also take oral antihistamines, such as diphenhydramine (Benadryl, others), which may also help you sleep better.  Watch your skin for any purulent  (pus) drainage or red streaking from the site.  If this occurs, contact your provider.  You may require an antibiotic for a skin infection.  Make sure that the clothes you were wearing as well as any towels or sheets that may have come in contact with the oil (urushiol) are washed in detergent and hot water.       I have developed the following plan to treat your condition:   I am prescribing a 6 day prednisone  taper.  Day 1: Take 2 tablets with breakfast, 1 tablet with lunch, 1 tablet with supper, and 2 tablets at bedtime Day 2: Take 2 tablets with breakfast, 1 tablet with lunch, 1 tablet with supper, and 1 tablet at bedtime Day 3: Take 1 tablet at breakfast, 1 tablet with lunch, 1 tablet with supper, and 1 tablet at bedtime Day 4: Take 1 tablet with breakfast, 1 tablet with supper and 1 tablet at bedtime Day 5: Take 1 tablet with breakfast and 1 tablet with supper or bedtime Day 6: Take 1 tablet with breakfast  I am prescribing triamcinolone  0.1 % cream -- apply to the affected area(s) in a thin layer, twice daily for up to 14 days. Do not apply to face, privates or armpit regions.    What can you do to prevent this rash?  Avoid the plants.  Learn how to identify poison  ivy, poison oak and poison sumac in all seasons.  When hiking or engaging in other activities that might expose you to these plants, try to stay on cleared pathways.  If camping, make sure you pitch your tent in an area free of these plants.  Keep pets from running through wooded areas so that urushiol doesn't accidentally stick to their fur, which you may touch.  Remove or kill the plants.  In your yard, you can get rid of poison ivy by applying an herbicide or pulling it out of the ground, including the roots, while wearing heavy gloves.  Afterward remove the gloves and thoroughly wash them and your hands.  Don't burn poison ivy or related plants because the urushiol can be carried by smoke.  Wear protective clothing.  If  needed, protect your skin by wearing socks, boots, pants, long sleeves and vinyl gloves.  Wash your skin right away.  Washing off the oil with soap and water within 30 minutes of exposure may reduce your chances of getting a poison ivy rash.  Even washing after an hour or so can help reduce the severity of the rash.  If you walk through some poison ivy and then later touch your shoes, you may get some urushiol on your hands, which may then transfer to your face or body by touching or rubbing.  If the contaminated object isn't cleaned, the urushiol on it can still cause a skin reaction years later.    Be careful not to reuse towels after you have washed your skin.  Also carefully wash clothing in detergent and hot water to remove all traces of the oil.  Handle contaminated clothing carefully so you don't transfer the urushiol to yourself, furniture, rugs or appliances.  Remember that pets can carry the oil on their fur and paws.  If you think your pet may be contaminated with urushiol, put on some long rubber gloves and give your pet a bath.  Finally, be careful not to burn these plants as the smoke can contain traces of the oil.  Inhaling the smoke may result in difficulty breathing. If that occurred you should see a physician as soon as possible.  See your doctor right away if:  The reaction is severe or widespread You inhaled the smoke from burning poison ivy and are having difficulty breathing Your skin continues to swell The rash affects your eyes, mouth or genitals Blisters are oozing pus You develop a fever greater than 100 F (37.8 C) The rash doesn't get better within a few weeks.  If you scratch the poison ivy rash, bacteria under your fingernails may cause the skin to become infected.  See your doctor if pus starts oozing from the blisters.  Treatment generally includes antibiotics.  Poison ivy treatments are usually limited to self-care methods.  And the rash typically goes away  on its own in two to three weeks.     If the rash is widespread or results in a large number of blisters, your doctor may prescribe an oral corticosteroid, such as prednisone .  If a bacterial infection has developed at the rash site, your doctor may give you a prescription for an oral antibiotic.  MAKE SURE YOU  Understand these instructions. Will watch your condition. Will get help right away if you are not doing well or get worse.   Thank you for choosing an e-visit.  Your e-visit answers were reviewed by a board certified advanced clinical practitioner to complete your personal care  plan. Depending upon the condition, your plan could have included both over the counter or prescription medications.  Please review your pharmacy choice. Make sure the pharmacy is open so you can pick up prescription now. If there is a problem, you may contact your provider through [employer] and have the prescription routed to another pharmacy.  Your safety is important to us . If you have drug allergies check your prescription carefully.   For the next 24 hours you can use MyChart to ask questions about today's visit, request a non-urgent call back, or ask for a work or school excuse. You will get an email in the next two days asking about your experience. I hope that your e-visit has been valuable and will speed your recovery.     I have spent 5 minutes in review of e-visit questionnaire, review and updating patient chart, medical decision making and response to patient.   Delon CHRISTELLA Dickinson, PA-C

## 2023-08-23 ENCOUNTER — Other Ambulatory Visit: Payer: Self-pay | Admitting: Family Medicine

## 2023-09-23 ENCOUNTER — Other Ambulatory Visit: Payer: Self-pay | Admitting: Family Medicine

## 2023-10-01 ENCOUNTER — Other Ambulatory Visit: Payer: Self-pay | Admitting: Family Medicine

## 2023-10-03 ENCOUNTER — Telehealth: Payer: Self-pay | Admitting: Family Medicine

## 2023-10-03 DIAGNOSIS — F909 Attention-deficit hyperactivity disorder, unspecified type: Secondary | ICD-10-CM

## 2023-10-03 NOTE — Telephone Encounter (Signed)
 Copied from CRM [id number]. Topic: Clinical - Medication Refill >> Oct 03, 2023 12:47 PM Laurier C wrote: Medication:  amphetamine -dextroamphetamine  (ADDERALL XR) 30 MG 24 hr capsule amphetamine -dextroamphetamine  (ADDERALL) 15 MG tablet gabapentin  (NEURONTIN ) 300 MG capsule  Has the patient contacted their pharmacy? Yes For the Gabapentin    This is the patient's preferred pharmacy:  [facility] [address] [place] - [address] [place] [address] Phone: [phone] Fax: [phone]  Is this the correct pharmacy for this prescription? Yes If no, delete pharmacy and type the correct one.   Has the prescription been filled recently? No  Is the patient out of the medication? No  Has the patient been seen for an appointment in the last year OR does the patient have an upcoming appointment? Yes  Can we respond through MyChart? Yes  Agent: Please be advised that Rx refills may take up to 3 business days. We ask that you follow-up with your pharmacy.

## 2023-10-07 ENCOUNTER — Ambulatory Visit: Admission: EM | Admit: 2023-10-07 | Discharge: 2023-10-07 | Disposition: A | Discharge status: Home / Self Care

## 2023-10-07 ENCOUNTER — Encounter (HOSPITAL_BASED_OUTPATIENT_CLINIC_OR_DEPARTMENT_OTHER): Payer: Self-pay | Admitting: Emergency Medicine

## 2023-10-07 ENCOUNTER — Emergency Department (HOSPITAL_BASED_OUTPATIENT_CLINIC_OR_DEPARTMENT_OTHER)
Admission: EM | Admit: 2023-10-07 | Discharge: 2023-10-08 | Disposition: A | Discharge status: Home / Self Care | Source: Ambulatory Visit | Attending: Emergency Medicine | Admitting: Emergency Medicine

## 2023-10-07 ENCOUNTER — Other Ambulatory Visit: Payer: Self-pay

## 2023-10-07 ENCOUNTER — Emergency Department (HOSPITAL_BASED_OUTPATIENT_CLINIC_OR_DEPARTMENT_OTHER)

## 2023-10-07 DIAGNOSIS — J45909 Unspecified asthma, uncomplicated: Secondary | ICD-10-CM | POA: Insufficient documentation

## 2023-10-07 DIAGNOSIS — Z720 Tobacco use: Secondary | ICD-10-CM | POA: Diagnosis not present

## 2023-10-07 DIAGNOSIS — R590 Localized enlarged lymph nodes: Secondary | ICD-10-CM | POA: Insufficient documentation

## 2023-10-07 DIAGNOSIS — L03221 Cellulitis of neck: Secondary | ICD-10-CM | POA: Insufficient documentation

## 2023-10-07 DIAGNOSIS — L0211 Cutaneous abscess of neck: Secondary | ICD-10-CM | POA: Diagnosis not present

## 2023-10-07 DIAGNOSIS — R591 Generalized enlarged lymph nodes: Secondary | ICD-10-CM

## 2023-10-07 DIAGNOSIS — R221 Localized swelling, mass and lump, neck: Secondary | ICD-10-CM

## 2023-10-07 LAB — CBC WITH DIFFERENTIAL/PLATELET
Abs Immature Granulocytes: 0.07 K/uL (ref 0.00–0.07)
Basophils Absolute: 0.1 K/uL (ref 0.0–0.1)
Basophils Relative: 1 %
Eosinophils Absolute: 0.3 K/uL (ref 0.0–0.5)
Eosinophils Relative: 4 %
HCT: 44.9 % (ref 39.0–52.0)
Hemoglobin: 15.9 g/dL (ref 13.0–17.0)
Immature Granulocytes: 1 %
Lymphocytes Relative: 31 %
Lymphs Abs: 2.8 K/uL (ref 0.7–4.0)
MCH: 30.3 pg (ref 26.0–34.0)
MCHC: 35.4 g/dL (ref 30.0–36.0)
MCV: 85.5 fL (ref 80.0–100.0)
Monocytes Absolute: 0.8 K/uL (ref 0.1–1.0)
Monocytes Relative: 8 %
Neutro Abs: 5.2 K/uL (ref 1.7–7.7)
Neutrophils Relative %: 55 %
Platelets: 368 K/uL (ref 150–400)
RBC: 5.25 MIL/uL (ref 4.22–5.81)
RDW: 12 % (ref 11.5–15.5)
WBC: 9.3 K/uL (ref 4.0–10.5)
nRBC: 0 % (ref 0.0–0.2)

## 2023-10-07 LAB — COMPREHENSIVE METABOLIC PANEL WITH GFR
ALT: 30 U/L (ref 0–44)
AST: 23 U/L (ref 15–41)
Albumin: 4.4 g/dL (ref 3.5–5.0)
Alkaline Phosphatase: 65 U/L (ref 38–126)
Anion gap: 10 (ref 5–15)
BUN: 14 mg/dL (ref 6–20)
CO2: 30 mmol/L (ref 22–32)
Calcium: 9.4 mg/dL (ref 8.9–10.3)
Chloride: 101 mmol/L (ref 98–111)
Creatinine, Ser: 1.04 mg/dL (ref 0.61–1.24)
GFR, Estimated: >60 mL/min (ref >60)
Glucose, Bld: 111 mg/dL — ABNORMAL HIGH (ref 70–99)
Potassium: 4 mmol/L (ref 3.5–5.1)
Sodium: 140 mmol/L (ref 135–145)
Total Bilirubin: 0.4 mg/dL (ref 0.0–1.2)
Total Protein: 6.6 g/dL (ref 6.5–8.1)

## 2023-10-07 MED ORDER — IOHEXOL 300 MG/ML  SOLN
80.0000 mL | Freq: Once | INTRAMUSCULAR | Status: AC | PRN
  Administered 2023-10-07: 80 mL via INTRAVENOUS

## 2023-10-07 MED ORDER — AMPHETAMINE-DEXTROAMPHET ER 30 MG PO CP24: 30.0000 mg | ORAL_CAPSULE | Freq: Every day | ORAL | 0 refills | Status: DC

## 2023-10-07 MED ORDER — DOXYCYCLINE HYCLATE 100 MG PO TABS
100.0000 mg | ORAL_TABLET | Freq: Once | ORAL | Status: DC
Start: 2023-10-08 — End: 2023-10-07

## 2023-10-07 MED ORDER — GABAPENTIN 300 MG PO CAPS: ORAL_CAPSULE | ORAL | 0 refills | Status: DC

## 2023-10-07 MED ORDER — AMPHETAMINE-DEXTROAMPHETAMINE 15 MG PO TABS: 15.0000 mg | ORAL_TABLET | Freq: Every day | ORAL | 0 refills | Status: DC

## 2023-10-07 MED ORDER — CLINDAMYCIN HCL 150 MG PO CAPS: 300.0000 mg | ORAL_CAPSULE | Freq: Three times a day (TID) | ORAL | 0 refills | Status: AC

## 2023-10-07 MED ORDER — CLINDAMYCIN PHOSPHATE 600 MG/50ML IV SOLN
600.0000 mg | Freq: Once | INTRAVENOUS | Status: AC
  Administered 2023-10-07: 600 mg via INTRAVENOUS
  Filled 2023-10-07: qty 50

## 2023-10-07 NOTE — ED Triage Notes (Signed)
 Pt from UC- reports abscess to L neck x 3 days, worsening. Reports pain with swallowing.  Denies fever.

## 2023-10-07 NOTE — ED Notes (Signed)
 Patient is being discharged from the Urgent Care and sent to the Emergency Department via POV. Per Ozell Major FNP, patient is in need of higher level of care due to need for advanced imaging of neck. Patient is aware and verbalizes understanding of plan of care.  Vitals:   10/07/23 1904  BP: 131/83  Pulse: 81  Resp: 19  Temp: 97.9 F (36.6 C)  SpO2: 98%

## 2023-10-07 NOTE — ED Provider Notes (Signed)
 [place] [facility] Provider Note   CSN: [id number] Arrival date & time: 10/07/23  1949     Patient presents with: Abscess   Edwin Williams is a 38 y.o. male.  {Add pertinent medical, surgical, social history, OB history to HPI:32947} HPI     Prior to Admission medications   Medication Sig Start Date End Date Taking? Authorizing Provider  amphetamine -dextroamphetamine  (ADDERALL XR) 30 MG 24 hr capsule Take 1 capsule (30 mg total) by mouth daily. 10/07/23   Alvia Bring, DO  amphetamine -dextroamphetamine  (ADDERALL) 15 MG tablet Take 1 tablet by mouth daily. 10/07/23   Alvia Bring, DO  cyclobenzaprine  (FLEXERIL ) 10 MG tablet Take 1 tablet (10 mg total) by mouth 3 (three) times daily as needed for muscle spasms. 07/01/23   Alvia Bring, DO  gabapentin  (NEURONTIN ) 300 MG capsule TAKE 1 CAPSULE BY MOUTH EVERY 8 HOURS AS NEEDED 10/07/23   Alvia Bring, DO  naproxen  (NAPROSYN ) 500 MG tablet TAKE 1 TABLET BY MOUTH TWICE DAILY WITH A MEAL 09/23/23   Alvia Bring, DO  Nicotine Polacrilex (TGT NICOTINE MT) Use as directed 4-8 mg in the mouth or throat every 2 (two) hours as needed.    [provider]  predniSONE  (STERAPRED UNI-PAK 21 TAB) 10 MG (21) TBPK tablet 6 day taper, take as directed on package instructions 08/15/23   Vivienne Delon HERO, PA-C  triamcinolone  cream (KENALOG ) 0.1 % Apply 1 Application topically 2 (two) times daily. 08/15/23   Vivienne Delon HERO, PA-C    Allergies: Amoxicillin    Review of Systems  Updated Vital Signs BP (!) 140/93   Pulse 81   Temp 98.3 F (36.8 C) (Oral)   Resp 15   Ht 5' 11 (1.803 m)   Wt 117.9 kg   SpO2 100%   BMI 36.26 kg/m   Physical Exam  (all labs ordered are listed, but only abnormal results are displayed) Labs Reviewed  COMPREHENSIVE METABOLIC PANEL WITH GFR - Abnormal; Notable for the following components:      Result Value   Glucose, Bld 111 (*)    All other components  within normal limits  CBC WITH DIFFERENTIAL/PLATELET    EKG: None  Radiology: CT Soft Tissue Neck W Contrast Result Date: 10/07/2023 EXAM: CT NECK WITH CONTRAST 10/07/2023 09:24:00 PM TECHNIQUE: CT of the neck was performed with the administration of intravenous contrast. Multiplanar reformatted images are provided for review. Automated exposure control, iterative reconstruction, and/or weight based adjustment of the mA/kV was utilized to reduce the radiation dose to as low as reasonably achievable. COMPARISON: None available. CLINICAL HISTORY: Soft tissue infection suspected, neck, xray done. Soft tissue infection suspected, swelling to left side of neck below the ear. FINDINGS: AERODIGESTIVE TRACT: No discrete mass. No edema. SALIVARY GLANDS: The parotid and submandibular glands are unremarkable. THYROID: Unremarkable. LYMPH NODES: Inferior to the left ear there is a 12 mm lymph node with mild adjacent inflammatory change. No other enlarged or abnormal density lymph nodes. SOFT TISSUES: No fluid collection. BRAIN, ORBITS, SINUSES AND MASTOIDS: No acute abnormality. No mastoid or middle ear effusion. LUNGS AND MEDIASTINUM: No acute abnormality. BONES: No focal bone abnormality. IMPRESSION: 1. 12 mm lymph node inferior to the left ear with mild adjacent inflammatory change, no fluid collection. 2. No other enlarged or abnormal density lymph nodes. 3. No mastoid or middle ear effusion. Electronically signed by: Franky Stanford MD 10/07/2023 10:33 PM EDT RP Workstation: [id number]    {Document cardiac monitor, telemetry assessment  procedure when appropriate:32947} Procedures   Medications Ordered in the ED  clindamycin  (CLEOCIN ) IVPB 600 mg (0 mg Intravenous Stopped 10/07/23 2244)  iohexol  (OMNIPAQUE ) 300 MG/ML solution 80 mL (80 mLs Intravenous Contrast Given 10/07/23 2111)      {Click here for ABCD2, HEART and other calculators REFRESH Note before signing:1}                              Medical  Decision Making Amount and/or Complexity of Data Reviewed Labs: ordered. Radiology: ordered.  Risk Prescription drug management.   ***  {Document critical care time when appropriate  Document review of labs and clinical decision tools ie CHADS2VASC2, etc  Document your independent review of radiology images and any outside records  Document your discussion with family members, caretakers and with consultants  Document social determinants of health affecting pt's care  Document your decision making why or why not admission, treatments were needed:32947:::1}   Final diagnoses:  None    ED Discharge Orders     None

## 2023-10-07 NOTE — ED Triage Notes (Signed)
 Pt presents to [facility] with co left sided neck swelling/ abscess and pain for 3 days. Pt reports he noticed  this swelling about 3 days ago but has had increased pain the last 2 days with the sensation that something is draining in his mouth. Pt reports voice sounds different and difficulty swallowing.

## 2023-10-07 NOTE — ED Notes (Signed)
 Patient transported to CT

## 2023-10-07 NOTE — Discharge Instructions (Addendum)
 Advised patient to go to [facility] ED now for further evaluation of neck swellingabscess and probable CT of soft tissue of the neck.
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
## 2023-10-08 ENCOUNTER — Telehealth: Payer: Self-pay

## 2023-10-08 NOTE — Telephone Encounter (Signed)
 Called to check on patient. Pt went to ER as was told by [facility] and had CT scan, was told it was a swollen lymph node d/t infected hair follicle and was put on abx. Just picked up meds. No needs from ucc at this time.

## 2023-10-19 DIAGNOSIS — R600 Localized edema: Secondary | ICD-10-CM | POA: Diagnosis not present

## 2023-10-19 DIAGNOSIS — M542 Cervicalgia: Secondary | ICD-10-CM | POA: Diagnosis not present

## 2023-10-19 DIAGNOSIS — Z79899 Other long term (current) drug therapy: Secondary | ICD-10-CM | POA: Diagnosis not present

## 2023-10-19 DIAGNOSIS — Z87891 Personal history of nicotine dependence: Secondary | ICD-10-CM | POA: Diagnosis not present

## 2023-10-19 DIAGNOSIS — F909 Attention-deficit hyperactivity disorder, unspecified type: Secondary | ICD-10-CM | POA: Diagnosis not present

## 2023-10-19 DIAGNOSIS — L0211 Cutaneous abscess of neck: Secondary | ICD-10-CM | POA: Diagnosis not present

## 2023-10-19 DIAGNOSIS — Z880 Allergy status to penicillin: Secondary | ICD-10-CM | POA: Diagnosis not present

## 2023-10-19 DIAGNOSIS — R590 Localized enlarged lymph nodes: Secondary | ICD-10-CM | POA: Diagnosis not present

## 2023-10-21 DIAGNOSIS — R221 Localized swelling, mass and lump, neck: Secondary | ICD-10-CM | POA: Diagnosis not present

## 2023-10-21 DIAGNOSIS — R590 Localized enlarged lymph nodes: Secondary | ICD-10-CM | POA: Diagnosis not present

## 2023-10-21 DIAGNOSIS — I899 Noninfective disorder of lymphatic vessels and lymph nodes, unspecified: Secondary | ICD-10-CM | POA: Diagnosis not present

## 2023-10-31 ENCOUNTER — Other Ambulatory Visit: Payer: Self-pay | Admitting: Family Medicine

## 2023-11-08 ENCOUNTER — Other Ambulatory Visit: Payer: Self-pay | Admitting: Family Medicine

## 2023-12-05 ENCOUNTER — Other Ambulatory Visit: Payer: Self-pay | Admitting: Family Medicine

## 2024-01-01 ENCOUNTER — Other Ambulatory Visit: Payer: Self-pay | Admitting: Family Medicine

## 2024-01-05 ENCOUNTER — Ambulatory Visit: Admitting: Family Medicine

## 2024-01-05 ENCOUNTER — Encounter: Payer: Self-pay | Admitting: Family Medicine

## 2024-01-05 VITALS — BP 130/79 | HR 91 | Ht 71.0 in | Wt 272.0 lb

## 2024-01-05 DIAGNOSIS — F909 Attention-deficit hyperactivity disorder, unspecified type: Secondary | ICD-10-CM | POA: Diagnosis not present

## 2024-01-05 DIAGNOSIS — E66811 Obesity, class 1: Secondary | ICD-10-CM

## 2024-01-05 DIAGNOSIS — M5416 Radiculopathy, lumbar region: Secondary | ICD-10-CM | POA: Insufficient documentation

## 2024-01-05 DIAGNOSIS — E6609 Other obesity due to excess calories: Secondary | ICD-10-CM

## 2024-01-05 DIAGNOSIS — Z6834 Body mass index (BMI) 34.0-34.9, adult: Secondary | ICD-10-CM

## 2024-01-05 MED ORDER — AMPHETAMINE-DEXTROAMPHETAMINE 15 MG PO TABS: 15.0000 mg | ORAL_TABLET | Freq: Every day | ORAL | 0 refills | Status: AC

## 2024-01-05 MED ORDER — ZEPBOUND 2.5 MG/0.5ML ~~LOC~~ SOAJ: 2.5000 mg | SUBCUTANEOUS | 0 refills | Status: DC

## 2024-01-05 MED ORDER — AMPHETAMINE-DEXTROAMPHET ER 30 MG PO CP24: 30.0000 mg | ORAL_CAPSULE | Freq: Every day | ORAL | 0 refills | Status: AC

## 2024-01-05 MED ORDER — PREDNISONE 10 MG (48) PO TBPK: ORAL_TABLET | Freq: Every day | ORAL | 0 refills | Status: DC

## 2024-01-05 MED ORDER — ZEPBOUND 5 MG/0.5ML ~~LOC~~ SOAJ: 5.0000 mg | SUBCUTANEOUS | 0 refills | Status: DC
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
## 2024-01-05 NOTE — Assessment & Plan Note (Signed)
 Doing well with combination of adderall xr and adderall in the afternoon.  Symptoms are well controlled at this time. Will plan to continue this at current strength.

## 2024-01-05 NOTE — Assessment & Plan Note (Signed)
 Interested in trying GLP-1.  Willing to pay oop if insurance does not cover. Reviewed sided effects related to medication.

## 2024-01-05 NOTE — Assessment & Plan Note (Signed)
 Adding 12 day prednisone  taper.  Given handout for home exercise plan.  F/u in 6 weeks.

## 2024-01-06 ENCOUNTER — Other Ambulatory Visit (HOSPITAL_COMMUNITY): Payer: Self-pay

## 2024-01-06 ENCOUNTER — Telehealth: Payer: Self-pay

## 2024-01-06 NOTE — Telephone Encounter (Signed)
 Pharmacy Patient Advocate Encounter   Received notification from CoverMyMeds that prior authorization for Zepbound 2.5mg /0.55ml is required/requested.   Insurance verification completed.   The patient is insured through [facility].   Per test claim: Per test claim, medication is not covered due to plan/benefit exclusion, PA not submitted at this time     Patient could try the self pay option through [facility]. Starting does of 2.5mg  is as low as $299, 5mg  is as low as $399 and the 7.5mg  and up are as low as $449.

## 2024-01-09 ENCOUNTER — Other Ambulatory Visit: Payer: Self-pay | Admitting: Family Medicine

## 2024-02-01 ENCOUNTER — Other Ambulatory Visit: Payer: Self-pay | Admitting: Family Medicine

## 2024-02-16 ENCOUNTER — Encounter: Payer: Self-pay | Admitting: Family Medicine

## 2024-02-16 ENCOUNTER — Ambulatory Visit: Admitting: Family Medicine

## 2024-02-16 VITALS — BP 120/79 | HR 83 | Ht 71.0 in | Wt 270.0 lb

## 2024-02-16 DIAGNOSIS — M5416 Radiculopathy, lumbar region: Secondary | ICD-10-CM | POA: Diagnosis not present

## 2024-02-16 DIAGNOSIS — Z6834 Body mass index (BMI) 34.0-34.9, adult: Secondary | ICD-10-CM | POA: Diagnosis not present

## 2024-02-16 DIAGNOSIS — E6609 Other obesity due to excess calories: Secondary | ICD-10-CM | POA: Diagnosis not present

## 2024-02-16 DIAGNOSIS — E66811 Obesity, class 1: Secondary | ICD-10-CM

## 2024-02-16 MED ORDER — TIRZEPATIDE-WEIGHT MANAGEMENT 5 MG/0.5ML ~~LOC~~ SOLN: 5.0000 mg | SUBCUTANEOUS | 1 refills | Status: DC

## 2024-02-16 MED ORDER — TIRZEPATIDE-WEIGHT MANAGEMENT 2.5 MG/0.5ML ~~LOC~~ SOLN: 2.5000 mg | SUBCUTANEOUS | 0 refills | Status: AC

## 2024-02-16 MED ORDER — CYCLOBENZAPRINE HCL 10 MG PO TABS: 10.0000 mg | ORAL_TABLET | Freq: Three times a day (TID) | ORAL | 0 refills | Status: DC | PRN

## 2024-02-16 MED ORDER — CYCLOBENZAPRINE HCL 10 MG PO TABS: 10.0000 mg | ORAL_TABLET | Freq: Three times a day (TID) | ORAL | 0 refills | Status: AC | PRN
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
## 2024-02-16 NOTE — Assessment & Plan Note (Signed)
 He would like to change rx for zepbound  send to [facility].  Follow up in 2 months.

## 2024-02-16 NOTE — Assessment & Plan Note (Addendum)
 Some improvement but still with radicular symptoms.  Encouraged incorporation of home exercises.  Declines PT referral.  MRI if not improving.

## 2024-02-20 ENCOUNTER — Other Ambulatory Visit: Payer: Self-pay | Admitting: Family Medicine

## 2024-03-08 ENCOUNTER — Other Ambulatory Visit: Payer: Self-pay | Admitting: Family Medicine

## 2024-03-09 ENCOUNTER — Other Ambulatory Visit: Payer: Self-pay | Admitting: Family Medicine

## 2024-03-11 ENCOUNTER — Other Ambulatory Visit: Payer: Self-pay | Admitting: Family Medicine

## 2024-03-11 MED ORDER — TIRZEPATIDE-WEIGHT MANAGEMENT 5 MG/0.5ML ~~LOC~~ SOLN: 5.0000 mg | SUBCUTANEOUS | 1 refills | Status: AC

## 2024-04-19 ENCOUNTER — Ambulatory Visit: Admitting: Family Medicine
